# Patient Record
Sex: Female | Born: 1995 | Race: Black or African American | Hispanic: No | Marital: Married | Smoking: Never smoker
Health system: Southern US, Community
[De-identification: ages and names within clinical notes are randomized; demographics above are authoritative.]

## PROBLEM LIST (undated history)

## (undated) ENCOUNTER — Inpatient Hospital Stay (HOSPITAL_COMMUNITY): Payer: Self-pay

## (undated) DIAGNOSIS — R51 Headache: Secondary | ICD-10-CM

## (undated) DIAGNOSIS — R519 Headache, unspecified: Secondary | ICD-10-CM

## (undated) DIAGNOSIS — Z973 Presence of spectacles and contact lenses: Secondary | ICD-10-CM

## (undated) DIAGNOSIS — T7840XA Allergy, unspecified, initial encounter: Secondary | ICD-10-CM

## (undated) HISTORY — DX: Headache, unspecified: R51.9

## (undated) HISTORY — DX: Allergy, unspecified, initial encounter: T78.40XA

## (undated) HISTORY — DX: Presence of spectacles and contact lenses: Z97.3

## (undated) HISTORY — DX: Headache: R51

## (undated) HISTORY — PX: WISDOM TOOTH EXTRACTION: SHX21

---
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
## 2014-02-16 ENCOUNTER — Other Ambulatory Visit (INDEPENDENT_AMBULATORY_CARE_PROVIDER_SITE_OTHER): Payer: BC Managed Care – PPO

## 2014-02-16 DIAGNOSIS — Z23 Encounter for immunization: Secondary | ICD-10-CM

## 2014-02-16 DIAGNOSIS — Z111 Encounter for screening for respiratory tuberculosis: Secondary | ICD-10-CM

## 2014-02-19 LAB — TB SKIN TEST
Induration: 0 mm
TB Skin Test: NEGATIVE

## 2014-09-10 ENCOUNTER — Other Ambulatory Visit: Payer: Self-pay | Admitting: Family Medicine

## 2014-09-10 DIAGNOSIS — Z00129 Encounter for routine child health examination without abnormal findings: Secondary | ICD-10-CM

## 2014-09-10 MED ORDER — NORGESTIM-ETH ESTRAD TRIPHASIC 0.18/0.215/0.25 MG-35 MCG PO TABS
1.0000 | ORAL_TABLET | Freq: Every day | ORAL | Status: DC
Start: 2014-09-10 — End: 2014-09-30

## 2014-09-13 ENCOUNTER — Encounter: Payer: Self-pay | Admitting: Medical

## 2014-09-30 ENCOUNTER — Other Ambulatory Visit: Payer: Self-pay | Admitting: Medical

## 2014-10-29 ENCOUNTER — Other Ambulatory Visit: Payer: Self-pay | Admitting: Medical

## 2014-11-12 ENCOUNTER — Encounter: Payer: Self-pay | Admitting: Medical

## 2014-11-16 ENCOUNTER — Ambulatory Visit (INDEPENDENT_AMBULATORY_CARE_PROVIDER_SITE_OTHER): Payer: BLUE CROSS/BLUE SHIELD | Admitting: Medical

## 2014-11-16 ENCOUNTER — Encounter: Payer: Self-pay | Admitting: Medical

## 2014-11-16 VITALS — BP 110/70 | HR 92 | Temp 98.3°F | Resp 16 | Ht 68.5 in | Wt 241.0 lb

## 2014-11-16 DIAGNOSIS — L732 Hidradenitis suppurativa: Secondary | ICD-10-CM

## 2014-11-16 DIAGNOSIS — Z3040 Encounter for surveillance of contraceptives, unspecified: Secondary | ICD-10-CM

## 2014-11-16 DIAGNOSIS — Z113 Encounter for screening for infections with a predominantly sexual mode of transmission: Secondary | ICD-10-CM

## 2014-11-16 DIAGNOSIS — Z0000 Encounter for general adult medical examination without abnormal findings: Secondary | ICD-10-CM | POA: Diagnosis not present

## 2014-11-16 DIAGNOSIS — E669 Obesity, unspecified: Secondary | ICD-10-CM | POA: Diagnosis not present

## 2014-11-16 DIAGNOSIS — E049 Nontoxic goiter, unspecified: Secondary | ICD-10-CM

## 2014-11-16 DIAGNOSIS — R4589 Other symptoms and signs involving emotional state: Secondary | ICD-10-CM

## 2014-11-16 DIAGNOSIS — Z23 Encounter for immunization: Secondary | ICD-10-CM | POA: Diagnosis not present

## 2014-11-16 DIAGNOSIS — F329 Major depressive disorder, single episode, unspecified: Secondary | ICD-10-CM | POA: Diagnosis not present

## 2014-11-16 HISTORY — DX: Nontoxic goiter, unspecified: E04.9

## 2014-11-16 HISTORY — DX: Morbid (severe) obesity due to excess calories: E66.01

## 2014-11-16 HISTORY — DX: Hidradenitis suppurativa: L73.2

## 2014-11-16 LAB — POCT URINALYSIS DIPSTICK
Bilirubin, UA: NEGATIVE
Blood, UA: NEGATIVE
Glucose, UA: NEGATIVE
Ketones, UA: NEGATIVE
Leukocytes, UA: NEGATIVE
NITRITE UA: NEGATIVE
PH UA: 6
Spec Grav, UA: >=1.03
Urobilinogen, UA: NEGATIVE

## 2014-11-16 LAB — CBC
HEMATOCRIT: 41.5 % (ref 36.0–46.0)
HEMOGLOBIN: 13.6 g/dL (ref 12.0–15.0)
MCH: 29.1 pg (ref 26.0–34.0)
MCHC: 32.8 g/dL (ref 30.0–36.0)
MCV: 88.9 fL (ref 78.0–100.0)
MPV: 10.6 fL (ref 8.6–12.4)
Platelets: 384 10*3/uL (ref 150–400)
RBC: 4.67 MIL/uL (ref 3.87–5.11)
RDW: 13.8 % (ref 11.5–15.5)
WBC: 5.6 10*3/uL (ref 4.0–10.5)

## 2014-11-16 LAB — T4, FREE: Free T4: 1.11 ng/dL (ref 0.80–1.80)

## 2014-11-16 LAB — LIPID PANEL
CHOLESTEROL: 176 mg/dL (ref 0–200)
HDL: 44 mg/dL (ref 36–76)
LDL Cholesterol: 109 mg/dL — ABNORMAL HIGH (ref 0–99)
Total CHOL/HDL Ratio: 4 Ratio
Triglycerides: 117 mg/dL (ref <150)
VLDL: 23 mg/dL (ref 0–40)

## 2014-11-16 LAB — TSH: TSH: 4.142 u[IU]/mL (ref 0.350–4.500)

## 2014-11-16 LAB — T3: T3, Total: 208.9 ng/dL — ABNORMAL HIGH (ref 80.0–204.0)

## 2014-11-16 LAB — POCT URINE PREGNANCY: Preg Test, Ur: NEGATIVE

## 2014-11-16 MED ORDER — DOXYCYCLINE HYCLATE 100 MG PO TABS: 100.0000 mg | ORAL_TABLET | Freq: Two times a day (BID) | ORAL | Status: DC

## 2014-11-16 MED ORDER — NORGESTIM-ETH ESTRAD TRIPHASIC 0.18/0.215/0.25 MG-35 MCG PO TABS: 1.0000 | ORAL_TABLET | Freq: Every day | ORAL | Status: DC

## 2014-11-16 MED ORDER — CITALOPRAM HYDROBROMIDE 20 MG PO TABS: ORAL_TABLET | ORAL | Status: DC

## 2014-11-16 NOTE — Patient Instructions (Signed)
For hidradenitis   Begin doxycycline antibiotic twice daily for 7-10 days.     If this flares up again, you have 1 refill on this medication  For depressed mood   Establish with counseling and psychiatry in your college town  Call me within 2 weeks to let me know how the medication is going, but call sooner if needed  Hidradenitis Suppurativa, Sweat Gland Abscess Hidradenitis suppurativa is a long lasting (chronic), uncommon disease of the sweat glands. With this, boil-like lumps and scarring develop in the groin, some times under the arms (axillae), and under the breasts. It may also uncommonly occur behind the ears, in the crease of the buttocks, and around the genitals.  CAUSES  The cause is from a blocking of the sweat glands. They then become infected. It may cause drainage and odor. It is not contagious. So it cannot be given to someone else. It most often shows up in puberty (about 4610 to 20 years of age). But it may happen much later. It is similar to acne which is a disease of the sweat glands. This condition is slightly more common in African-Americans and women. SYMPTOMS  3. Hidradenitis usually starts as one or more red, tender, swellings in the groin or under the arms (axilla). 4. Over a period of hours to days the lesions get larger. They often open to the skin surface, draining clear to yellow-colored fluid. 5. The infected area heals with scarring. DIAGNOSIS  Your caregiver makes this diagnosis by looking at you. Sometimes cultures (growing germs on plates in the lab) may be taken. This is to see what germ (bacterium) is causing the infection.  TREATMENT   Topical germ killing medicine applied to the skin (antibiotics) are the treatment of choice. Antibiotics taken by mouth (systemic) are sometimes needed when the condition is getting worse or is severe.  Avoid tight-fitting clothing which traps moisture in.  Dirt does not cause hidradenitis and it is not caused by poor  hygiene.  Involved areas should be cleaned daily using an antibacterial soap. Some patients find that the liquid form of Lever 2000, applied to the involved areas as a lotion after bathing, can help reduce the odor related to this condition.  Sometimes surgery is needed to drain infected areas or remove scarred tissue. Removal of large amounts of tissue is used only in severe cases.  Birth control pills may be helpful.  Oral retinoids (vitamin A derivatives) for 6 to 12 months which are effective for acne may also help this condition.  Weight loss will improve but not cure hidradenitis. It is made worse by being overweight. But the condition is not caused by being overweight.  This condition is more common in people who have had acne.  It may become worse under stress. There is no medical cure for hidradenitis. It can be controlled, but not cured. The condition usually continues for years with periods of getting worse and getting better (remission). Document Released: 04/07/2004 Document Revised: 11/16/2011 Document Reviewed: 11/24/2013 [facility] Patient Information 2015 [place], [place]. This information is not intended to replace advice given to you by your health care provider. Make sure you discuss any questions you have with your health care provider.    Depression Depression refers to feeling sad, low, down in the dumps, blue, gloomy, or empty. In general, there are two kinds of depression: 6. Normal sadness or normal grief. This kind of depression is one that we all feel from time to time after upsetting  life experiences, such as the loss of a job or the ending of a relationship. This kind of depression is considered normal, is short lived, and resolves within a few days to 2 weeks. Depression experienced after the loss of a loved one (bereavement) often lasts longer than 2 weeks but normally gets better with time. 7. Clinical depression. This kind of depression lasts longer than normal  sadness or normal grief or interferes with your ability to function at home, at work, and in school. It also interferes with your personal relationships. It affects almost every aspect of your life. Clinical depression is an illness. Symptoms of depression can also be caused by conditions other than those mentioned above, such as:  Physical illness. Some physical illnesses, including underactive thyroid gland (hypothyroidism), severe anemia, specific types of cancer, diabetes, uncontrolled seizures, heart and lung problems, strokes, and chronic pain are commonly associated with symptoms of depression.  Side effects of some prescription medicine. In some people, certain types of medicine can cause symptoms of depression.  Substance abuse. Abuse of alcohol and illicit drugs can cause symptoms of depression. SYMPTOMS Symptoms of normal sadness and normal grief include the following:  Feeling sad or crying for short periods of time.  Not caring about anything (apathy).  Difficulty sleeping or sleeping too much.  No longer able to enjoy the things you used to enjoy.  Desire to be by oneself all the time (social isolation).  Lack of energy or motivation.  Difficulty concentrating or remembering.  Change in appetite or weight.  Restlessness or agitation. Symptoms of clinical depression include the same symptoms of normal sadness or normal grief and also the following symptoms:  Feeling sad or crying all the time.  Feelings of guilt or worthlessness.  Feelings of hopelessness or helplessness.  Thoughts of suicide or the desire to harm yourself (suicidal ideation).  Loss of touch with reality (psychotic symptoms). Seeing or hearing things that are not real (hallucinations) or having false beliefs about your life or the people around you (delusions and paranoia). DIAGNOSIS  The diagnosis of clinical depression is usually based on how bad the symptoms are and how long they have lasted.  Your health care provider will also ask you questions about your medical history and substance use to find out if physical illness, use of prescription medicine, or substance abuse is causing your depression. Your health care provider may also order blood tests. TREATMENT  Often, normal sadness and normal grief do not require treatment. However, sometimes antidepressant medicine is given for bereavement to ease the depressive symptoms until they resolve. The treatment for clinical depression depends on how bad the symptoms are but often includes antidepressant medicine, counseling with a mental health professional, or both. Your health care provider will help to determine what treatment is best for you. Depression caused by physical illness usually goes away with appropriate medical treatment of the illness. If prescription medicine is causing depression, talk with your health care provider about stopping the medicine, decreasing the dose, or changing to another medicine. Depression caused by the abuse of alcohol or illicit drugs goes away when you stop using these substances. Some adults need professional help in order to stop drinking or using drugs. SEEK IMMEDIATE MEDICAL CARE IF:  You have thoughts about hurting yourself or others.  You lose touch with reality (have psychotic symptoms).  You are taking medicine for depression and have a serious side effect. FOR MORE INFORMATION  National Alliance on Mental Illness: www.nami.org  [employer] of Mental Health: http://www.maynard.net/ Document Released: 08/21/2000 Document Revised: 01/08/2014 Document Reviewed: 11/23/2011 [facility] Patient Information 2015 [place], [place]. This information is not intended to replace advice given to you by your health care provider. Make sure you discuss any questions you have with your health care provider.

## 2014-11-16 NOTE — Progress Notes (Signed)
Subjective:   HPI  Brandi Manning is a 19 y.o. female who presents for a complete physical.   Preventative care: Last ophthalmology visit:n/a old doctor retired Last dental visit:n/a prior vaccinations: TD or Tdap:02/2014 Influenza:declined  Concerns: Gets sores under arms from time to time.  No pus drainage.  Has had these for a few weeks at a time in the past, they come and go.  No prior treatment.  Been dealing with depressed mood.  She notes feeling this way even back in high school, but sought out treatment early this freshmen year of college.   Had a few counseling visits but felt like the counselor was too busy with other things.   Feels down in mood.  Dad lives in [place], possibly going to prison, and he is suppose to be getting married soon too.  Her relationship with mom is good.  She is in a relationship with female x 11 mo.  Sexual activity, using condom.  No prior STD testing.  No problems with current OCPs.  No SI, no HI.  No other aggravating or relieving factors. No other complaint.  Reviewed their medical, surgical, family, social, medication, and allergy history and updated chart as appropriate.  Review of Systems Constitutional: -fever, -chills, -sweats, -unexpected weight change, -decreased appetite, -fatigue Allergy: -sneezing, -itching, -congestion Dermatology: -changing moles, --rash, -lumps ENT: -runny nose, -ear pain, -sore throat, -hoarseness, -sinus pain, -teeth pain, - ringing in ears, -hearing loss, -nosebleeds Cardiology: -chest pain, -palpitations, -swelling, -difficulty breathing when lying flat, -waking up short of breath Respiratory: -cough, -shortness of breath, -difficulty breathing with exercise or exertion, -wheezing, -coughing up blood Gastroenterology: -abdominal pain, -nausea, -vomiting, -diarrhea, -constipation, -blood in stool, -changes in bowel movement, -difficulty swallowing or eating Hematology: -bleeding, -bruising  Musculoskeletal: -joint  aches, -muscle aches, -joint swelling, -back pain, -neck pain, -cramping, -changes in gait Ophthalmology: denies vision changes, eye redness, itching, discharge Urology: -burning with urination, -difficulty urinating, -blood in urine, -urinary frequency, -urgency, -incontinence Neurology: -headache, -weakness, -tingling, -numbness, -memory loss, -falls, -dizziness Psychology: +depressed mood, -agitation, -sleep problems  Past Medical History  Diagnosis Date  . Wears glasses   . Headache     intermittent  . Allergy     seasonal    Past Surgical History  Procedure Laterality Date  . Wisdom tooth extraction      History   Social History  . Marital Status: Single    Spouse Name: N/A  . Number of Children: N/A  . Years of Education: N/A   Occupational History  . Not on file.   Social History Main Topics  . Smoking status: Never Smoker   . Smokeless tobacco: Not on file  . Alcohol Use: No  . Drug Use: No  . Sexual Activity: No   Other Topics Concern  . Not on file   Social History Narrative   [employer]ent to [employer].  Freshman at [employer] and wants to major in elementary education.   Exercise - sometimes, walking, diet is "poor."   In the environmental club.      Family History  Problem Relation Age of Onset  . Hypertension Mother   . Arthritis Mother   . Heart disease Mother     ?  . Diabetes Maternal Grandmother   . Heart disease Paternal Grandfather      Current outpatient prescriptions:  .  Norgestimate-Ethinyl Estradiol Triphasic (TRI-PREVIFEM) 0.18/0.215/0.25 MG-35 MCG tablet, Take 1 tablet by mouth daily., Disp: 28 tablet, Rfl: 11 .  citalopram (CELEXA) 20 MG tablet, 1/2 tablet po QHS x 1wk, then 1 tablet po QHS, Disp: 30 tablet, Rfl: 1 .  doxycycline (VIBRA-TABS) 100 MG tablet, Take 1 tablet (100 mg total) by mouth 2 (two) times daily., Disp: 20 tablet, Rfl: 1  No Known Allergies       Objective:   Physical Exam  Filed Vitals:    11/16/14 1022  BP: 110/70  Pulse: 92  Temp: 98.3 F (36.8 C)  Resp: 16   Wt Readings from Last 3 Encounters:  11/16/14 241 lb (109.317 kg) (99 %*, Z = 2.43)  12/28/13 220 lb (99.791 kg) (99 %*, Z = 2.20)  10/25/13 224 lb (101.606 kg) (99 %*, Z = 2.24)   * Growth percentiles are based on CDC 2-20 Years data.   General appearance: alert, no distress, WD/WN, obese AA female Skin: no worrisome lesions HEENT: normocephalic, conjunctiva/corneas normal, sclerae anicteric, PERRLA, EOMi, nares patent, no discharge or erythema, pharynx normal Oral cavity: MMM, tongue normal, teeth normal Neck: supple, no lymphadenopathy, +goiter mild,no distinct nodule, no masses, normal ROM Chest: non tender, normal shape and expansion Heart: RRR, normal S1, S2, no murmurs Lungs: CTA bilaterally, no wheezes, rhonchi, or rales Abdomen: +bs, soft, non tender, non distended, no masses, no hepatomegaly, no splenomegaly, no bruits Back: non tender, normal ROM, no scoliosis Musculoskeletal: upper extremities non tender, no obvious deformity, normal ROM throughout, lower extremities non tender, no obvious deformity, normal ROM throughout Extremities: no edema, no cyanosis, no clubbing Pulses: 2+ symmetric, upper and lower extremities, normal cap refill Neurological: alert, oriented x 3, CN2-12 intact, strength normal upper extremities and lower extremities, sensation normal throughout, DTRs 2+ throughout, no cerebellar signs, gait normal Psychiatric: pleasant , depressed affect,no smiling, seems down Breast: deferred Gyn: Normal external genitalia without lesions, vagina with normal mucosa, cervix without lesions, no cervical motion tenderness, no abnormal vaginal discharge.  Uterus and adnexa not enlarged, nontender, no masses.  Swab taken.  Exam chaperoned by nurse. Rectal: deferred   Assessment and Plan :    Encounter Diagnoses  Name Primary?  . Encounter for health maintenance examination in adult Yes  .  Depressed mood   . Hidradenitis axillaris   . Goiter   . Screen for STD (sexually transmitted disease)   . Need for HPV vaccination   . Obesity   . Encounter for surveillance of contraceptives     Physical exam - discussed healthy lifestyle, diet, exercise, preventative care, vaccinations, and addressed their concerns.  Routine labs today.  See your eye doctor yearly for routine vision care.  See your dentist yearly for routine dental care including hygiene visits twice yearly.  Depressed mood - PHQ-9 score of 19.  Discussed concerns, advised she seek out a different counselor.   Begin trial of Citalopram.  discussed risk/benefits of medication, discuss with mom that she is starting medication and to be aware of any adverse effects.    Hidradenitis - discussed hygiene, begin doxycyline, call report 1 wk.  Goiter - labs today  STD screening today. Discussed safe sex.  HPV vaccine - Counseled on the Human Papilloma virus vaccine.  Vaccine information sheet given.  HPV vaccine given after consent obtained.  Patient was advised to return in 6 months for HPV #3.    Obesity - discussed diet, exercise, healthy habits  Contraception - We discussed risks of OCPs including headache, nausea, breast tenderness, irregular bleeding or spotting, possible risks of blood clots, heart attack, stroke.  Discussed advantages of OCPs including possible decreased premenstrual symptoms, improving cramps, regulating cycles, decreasing heavy flow.  Discussed proper use of medication.  She understands the benefits, risks, and wishes to begin OCPs. Urine pregnancy negative.    Discussed safe sex, STD prevention, condom use.     Follow-up pending labs

## 2014-11-17 LAB — HIV ANTIBODY (ROUTINE TESTING W REFLEX): HIV 1&2 Ab, 4th Generation: NONREACTIVE

## 2014-11-17 LAB — GC/CHLAMYDIA PROBE AMP
CT Probe RNA: NEGATIVE
GC PROBE AMP APTIMA: NEGATIVE

## 2014-11-17 LAB — HEMOGLOBIN A1C
HEMOGLOBIN A1C: 5.4 % (ref <5.7)
MEAN PLASMA GLUCOSE: 108 mg/dL (ref <117)

## 2014-11-17 LAB — RPR

## 2014-11-22 ENCOUNTER — Other Ambulatory Visit: Payer: Self-pay | Admitting: Medical

## 2015-01-31 ENCOUNTER — Encounter: Payer: Self-pay | Admitting: Medical

## 2015-01-31 ENCOUNTER — Ambulatory Visit (INDEPENDENT_AMBULATORY_CARE_PROVIDER_SITE_OTHER): Payer: BLUE CROSS/BLUE SHIELD | Admitting: Medical

## 2015-01-31 VITALS — BP 120/78 | HR 86 | Temp 98.1°F | Resp 15 | Wt 237.0 lb

## 2015-01-31 DIAGNOSIS — F331 Major depressive disorder, recurrent, moderate: Secondary | ICD-10-CM

## 2015-01-31 MED ORDER — FLUOXETINE HCL 20 MG PO TABS: 20.0000 mg | ORAL_TABLET | Freq: Every day | ORAL | Status: DC

## 2015-01-31 NOTE — Patient Instructions (Signed)
Lets wean off Citalopram by taking 1/2 tablet daily for 5 days.  At the same time, take Prozac/Fluoxetine 1/2 tablet daily for 5 days  At day 6, go completely to 1 whole tablet daily of Fluoxetine/Prozac 20mg  daily  Lets recheck in about a month.

## 2015-01-31 NOTE — Progress Notes (Signed)
Subjective: Here for f/u on depression.  Medication not working.  Takes Citalopram at night.   Feels like she sleeps more, doesn't feel improvement on mood.   She is currently on summer break, looking for job.  Has been seeing a different counselor at school since last visit which has been helpful.  While home now she is staying with mom.  Great relationship with mom.   Recently got a bunny and taking care of the bunny, this brings her joy.  Been going to the gym.  Not attending church often.   Counselor has been conducting some phone visits with her recently while not in school.  In school at [employer].  She notes long history of depression symptoms going back to high school.  didn't initially seek out treatment until freshmen year of college. Had a few counseling visits but felt like the counselor was too busy with other things. Feels down in mood. Dad lives in [place] in prison. No SI, no HI. No other aggravating or relieving factors. No other complaint.   Objective: BP 120/78 mmHg  Pulse 86  Temp(Src) 98.1 F (36.7 C) (Oral)  Resp 15  Wt 237 lb (107.502 kg)  Gen: wd, wn, nad Psych: pleasant, good eye contact, answers questions appropriately   Assessment: Encounter Diagnosis  Name Primary?  . Major depressive disorder, recurrent episode, moderate Yes    Plan: Advised she c/t counseling, advised she and mom go to church, advised she c/t exercise, and medication changes today as below.  Failed citalopram, so begin trial of Fluoxetine.  F/u 46mo, sooner prn.   Patient Instructions  Lets wean off Citalopram by taking 1/2 tablet daily for 5 days.  At the same time, take Prozac/Fluoxetine 1/2 tablet daily for 5 days  At day 6, go completely to 1 whole tablet daily of Fluoxetine/Prozac 20mg  daily  Lets recheck in about a month.    30 min face to face with discussion, plan.

## 2015-03-30 ENCOUNTER — Emergency Department (HOSPITAL_COMMUNITY)
Admission: EM | Admit: 2015-03-30 | Discharge: 2015-03-30 | Disposition: A | Discharge status: Home / Self Care | Payer: BLUE CROSS/BLUE SHIELD | Attending: Emergency Medicine | Admitting: Emergency Medicine

## 2015-03-30 ENCOUNTER — Encounter (HOSPITAL_COMMUNITY): Payer: Self-pay | Admitting: Emergency Medicine

## 2015-03-30 DIAGNOSIS — R000 Tachycardia, unspecified: Secondary | ICD-10-CM | POA: Insufficient documentation

## 2015-03-30 DIAGNOSIS — R200 Anesthesia of skin: Secondary | ICD-10-CM | POA: Diagnosis not present

## 2015-03-30 DIAGNOSIS — R109 Unspecified abdominal pain: Secondary | ICD-10-CM | POA: Diagnosis present

## 2015-03-30 DIAGNOSIS — R61 Generalized hyperhidrosis: Secondary | ICD-10-CM | POA: Diagnosis not present

## 2015-03-30 DIAGNOSIS — R112 Nausea with vomiting, unspecified: Secondary | ICD-10-CM | POA: Insufficient documentation

## 2015-03-30 DIAGNOSIS — E860 Dehydration: Secondary | ICD-10-CM

## 2015-03-30 DIAGNOSIS — Z79818 Long term (current) use of other agents affecting estrogen receptors and estrogen levels: Secondary | ICD-10-CM | POA: Insufficient documentation

## 2015-03-30 DIAGNOSIS — E869 Volume depletion, unspecified: Secondary | ICD-10-CM | POA: Diagnosis not present

## 2015-03-30 LAB — CBC WITH DIFFERENTIAL/PLATELET
BASOS PCT: 0 % (ref 0–1)
Basophils Absolute: 0 10*3/uL (ref 0.0–0.1)
EOS ABS: 0.4 10*3/uL (ref 0.0–0.7)
Eosinophils Relative: 4 % (ref 0–5)
HCT: 45.8 % (ref 36.0–46.0)
Hemoglobin: 15 g/dL (ref 12.0–15.0)
LYMPHS ABS: 2.8 10*3/uL (ref 0.7–4.0)
Lymphocytes Relative: 29 % (ref 12–46)
MCH: 29 pg (ref 26.0–34.0)
MCHC: 32.8 g/dL (ref 30.0–36.0)
MCV: 88.4 fL (ref 78.0–100.0)
Monocytes Absolute: 0.7 10*3/uL (ref 0.1–1.0)
Monocytes Relative: 7 % (ref 3–12)
Neutro Abs: 5.8 10*3/uL (ref 1.7–7.7)
Neutrophils Relative %: 60 % (ref 43–77)
PLATELETS: 377 10*3/uL (ref 150–400)
RBC: 5.18 MIL/uL — AB (ref 3.87–5.11)
RDW: 13 % (ref 11.5–15.5)
WBC: 9.7 10*3/uL (ref 4.0–10.5)

## 2015-03-30 LAB — BASIC METABOLIC PANEL
Anion gap: 7 (ref 5–15)
BUN: 11 mg/dL (ref 6–20)
CO2: 24 mmol/L (ref 22–32)
Calcium: 9.1 mg/dL (ref 8.9–10.3)
Chloride: 108 mmol/L (ref 101–111)
Creatinine, Ser: 1.03 mg/dL — ABNORMAL HIGH (ref 0.44–1.00)
GFR calc Af Amer: >60 mL/min (ref >60)
GFR calc non Af Amer: >60 mL/min (ref >60)
GLUCOSE: 109 mg/dL — AB (ref 65–99)
POTASSIUM: 4.1 mmol/L (ref 3.5–5.1)
SODIUM: 139 mmol/L (ref 135–145)

## 2015-03-30 LAB — HCG, SERUM, QUALITATIVE: Preg, Serum: NEGATIVE

## 2015-03-30 MED ORDER — ONDANSETRON HCL 4 MG/2ML IJ SOLN
4.0000 mg | Freq: Once | INTRAMUSCULAR | Status: AC
  Administered 2015-03-30: 4 mg via INTRAVENOUS
  Filled 2015-03-30: qty 2

## 2015-03-30 MED ORDER — SODIUM CHLORIDE 0.9 % IV BOLUS (SEPSIS)
2000.0000 mL | Freq: Once | INTRAVENOUS | Status: AC
  Administered 2015-03-30: 2000 mL via INTRAVENOUS

## 2015-03-30 NOTE — Discharge Instructions (Signed)
Rest. Push fluids. Allow at least 2 weeks in between any future episodes of plasma donation.  Dehydration, Adult Dehydration is when you lose more fluids from the body than you take in. Vital organs like the kidneys, brain, and heart cannot function without a proper amount of fluids and salt. Any loss of fluids from the body can cause dehydration.  CAUSES   Vomiting.  Diarrhea.  Excessive sweating.  Excessive urine output.  Fever. SYMPTOMS  Mild dehydration  Thirst.  Dry lips.  Slightly dry mouth. Moderate dehydration  Very dry mouth.  Sunken eyes.  Skin does not bounce back quickly when lightly pinched and released.  Dark urine and decreased urine production.  Decreased tear production.  Headache. Severe dehydration  Very dry mouth.  Extreme thirst.  Rapid, weak pulse (more than 100 beats per minute at rest).  Cold hands and feet.  Not able to sweat in spite of heat and temperature.  Rapid breathing.  Blue lips.  Confusion and lethargy.  Difficulty being awakened.  Minimal urine production.  No tears. DIAGNOSIS  Your caregiver will diagnose dehydration based on your symptoms and your exam. Blood and urine tests will help confirm the diagnosis. The diagnostic evaluation should also identify the cause of dehydration. TREATMENT  Treatment of mild or moderate dehydration can often be done at home by increasing the amount of fluids that you drink. It is best to drink small amounts of fluid more often. Drinking too much at one time can make vomiting worse. Refer to the home care instructions below. Severe dehydration needs to be treated at the hospital where you will probably be given intravenous (IV) fluids that contain water and electrolytes. HOME CARE INSTRUCTIONS   Ask your caregiver about specific rehydration instructions.  Drink enough fluids to keep your urine clear or pale yellow.  Drink small amounts frequently if you have nausea and  vomiting.  Eat as you normally do.  Avoid:  Foods or drinks high in sugar.  Carbonated drinks.  Juice.  Extremely hot or cold fluids.  Drinks with caffeine.  Fatty, greasy foods.  Alcohol.  Tobacco.  Overeating.  Gelatin desserts.  Wash your hands well to avoid spreading bacteria and viruses.  Only take over-the-counter or prescription medicines for pain, discomfort, or fever as directed by your caregiver.  Ask your caregiver if you should continue all prescribed and over-the-counter medicines.  Keep all follow-up appointments with your caregiver. SEEK MEDICAL CARE IF:  You have abdominal pain and it increases or stays in one area (localizes).  You have a rash, stiff neck, or severe headache.  You are irritable, sleepy, or difficult to awaken.  You are weak, dizzy, or extremely thirsty. SEEK IMMEDIATE MEDICAL CARE IF:   You are unable to keep fluids down or you get worse despite treatment.  You have frequent episodes of vomiting or diarrhea.  You have blood or green matter (bile) in your vomit.  You have blood in your stool or your stool looks black and tarry.  You have not urinated in 6 to 8 hours, or you have only urinated a small amount of very dark urine.  You have a fever.  You faint. MAKE SURE YOU:   Understand these instructions.  Will watch your condition.  Will get help right away if you are not doing well or get worse. Document Released: 08/24/2005 Document Revised: 11/16/2011 Document Reviewed: 04/13/2011 [facility] Patient Information 2015 [place], [place]. This information is not intended to replace advice given to you  by your health care provider. Make sure you discuss any questions you have with your health care provider.  Rehydration, Adult Rehydration is the replacement of body fluids lost during dehydration. Dehydration is an extreme loss of body fluids to the point of body function impairment. There are many ways extreme fluid loss  can occur, including vomiting, diarrhea, or excess sweating. Recovering from dehydration requires replacing lost fluids, continuing to eat to maintain strength, and avoiding foods and beverages that may contribute to further fluid loss or may increase nausea. HOW TO REHYDRATE In most cases, rehydration involves the replacement of not only fluids but also carbohydrates and basic body salts. Rehydration with an oral rehydration solution is one way to replace essential nutrients lost through dehydration. An oral rehydration solution can be purchased at pharmacies, retail stores, and online. Premixed packets of powder that you combine with water to make a solution are also sold. You can prepare an oral rehydration solution at home by mixing the following ingredients together:    - tsp table salt.   tsp baking soda.   tsp salt substitute containing potassium chloride.  1 tablespoons sugar.  1 L (34 oz) of water. Be sure to use exact measurements. Including too much sugar can make diarrhea worse. Drink -1 cup (120-240 mL) of oral rehydration solution each time you have diarrhea or vomit. If drinking this amount makes your vomiting worse, try drinking smaller amounts more often. For example, drink 1-3 tsp every 5-10 minutes.  A general rule for staying hydrated is to drink 1-2 L of fluid per day. Talk to your caregiver about the specific amount you should be drinking each day. Drink enough fluids to keep your urine clear or pale yellow. EATING WHEN DEHYDRATED Even if you have had severe sweating or you are having diarrhea, do not stop eating. Many healthy items in a normal diet are okay to continue eating while recovering from dehydration. The following tips can help you to lessen nausea when you eat:  Ask someone else to prepare your food. Cooking smells may worsen nausea.  Eat in a well-ventilated room away from cooking smells.  Sit up when you eat. Avoid lying down until 1-2 hours after  eating.  Eat small amounts when you eat.  Eat foods that are easy to digest. These include soft, well-cooked, or mashed foods. FOODS AND BEVERAGES TO AVOID Avoid eating or drinking the following foods and beverages that may increase nausea or further loss of fluid:   Fruit juices with a high sugar content, such as concentrated juices.  Alcohol.  Beverages containing caffeine.  Carbonated drinks. They may cause a lot of gas.  Foods that may cause a lot of gas, such as cabbage, broccoli, and beans.  Fatty, greasy, and fried foods.  Spicy, very salty, and very sweet foods or drinks.  Foods or drinks that are very hot or very cold. Consume food or drinks at or near room temperature.  Foods that need a lot of chewing, such as raw vegetables.  Foods that are sticky or hard to swallow, such as peanut butter. Document Released: 11/16/2011 Document Revised: 05/18/2012 Document Reviewed: 11/16/2011 [facility] Patient Information 2015 [place], [place]. This information is not intended to replace advice given to you by your health care provider. Make sure you discuss any questions you have with your health care provider.

## 2015-03-30 NOTE — ED Notes (Signed)
Per EMS- was giving plasma today-became weak/diaphoretic and having abdominal cramps. Gave her animal crackers and two bottles of gatorade. Denied nausea up until arrival at ED- nausea started just now. Appeared to throw up all of the blue gatorade she drank. VS: BP 102/56 HR 68 SpO2 98% on RA. RR 18. CBG 180 mg/dl.

## 2015-03-30 NOTE — ED Notes (Signed)
Bed: WA24 Expected date:  Expected time:  Means of arrival:  Comments: Triage 2 

## 2015-03-30 NOTE — ED Provider Notes (Signed)
CSN: [id number]   Arrival date & time 03/30/15 1455  History  This chart was scribed for  Rolland Porter, MD by Bethel Born, ED Scribe. This patient was seen in room WTR2/WLPT2 and the patient's care was started at 3:15 PM.  Chief Complaint  Patient presents with  . Abdominal Pain  . Nausea    HPI The history is provided by the patient. No language interpreter was used.   Vivica Keeran is a 19 y.o. female who presents to the Emergency Department complaining of nausea and vomiting with onset today while donating plasma. Pt was unable to hold down the Gatorade that she was given after donating. She has given plasma 2 other times with the last time being almost a week ago. Associated symptoms include generalized weakness, dizziness, sweating, abdominal pain, and numbness in the hands. Pt denies chest pain and SOB. She has not spent more time outside recently. Denies pregnancy. No daily medication besides oral contraception.   Past Medical History  Diagnosis Date  . Wears glasses   . Headache     intermittent  . Allergy     seasonal    Past Surgical History  Procedure Laterality Date  . Wisdom tooth extraction      Family History  Problem Relation Age of Onset  . Hypertension Mother   . Arthritis Mother   . Heart disease Mother     ?  . Diabetes Maternal Grandmother   . Heart disease Paternal Grandfather     History  Substance Use Topics  . Smoking status: Never Smoker   . Smokeless tobacco: Not on file  . Alcohol Use: No     Review of Systems  Constitutional: Positive for diaphoresis. Negative for fever, chills, appetite change and fatigue.  HENT: Negative for mouth sores, sore throat and trouble swallowing.   Eyes: Negative for visual disturbance.  Respiratory: Negative for cough, chest tightness, shortness of breath and wheezing.   Cardiovascular: Negative for chest pain.  Gastrointestinal: Positive for nausea, vomiting and abdominal pain. Negative for diarrhea and  abdominal distention.  Endocrine: Negative for polydipsia, polyphagia and polyuria.  Genitourinary: Negative for dysuria, frequency and hematuria.  Musculoskeletal: Negative for gait problem.  Skin: Negative for color change, pallor and rash.  Neurological: Positive for dizziness, weakness and numbness. Negative for syncope, light-headedness and headaches.  Hematological: Does not bruise/bleed easily.  Psychiatric/Behavioral: Negative for behavioral problems and confusion.     Home Medications   Prior to Admission medications   Medication Sig Start Date End Date Taking? Authorizing Provider  Norgestimate-Ethinyl Estradiol Triphasic (TRI-PREVIFEM) 0.18/0.215/0.25 MG-35 MCG tablet Take 1 tablet by mouth daily. 11/16/14  Yes Kermit Balo Tysinger, PA-C  doxycycline (VIBRA-TABS) 100 MG tablet Take 1 tablet (100 mg total) by mouth 2 (two) times daily. Patient not taking: Reported on 03/30/2015 11/16/14   Kermit Balo Tysinger, PA-C  FLUoxetine (PROZAC) 20 MG tablet Take 1 tablet (20 mg total) by mouth daily. Patient not taking: Reported on 03/30/2015 01/31/15   Kermit Balo Tysinger, PA-C    Allergies  Review of patient's allergies indicates no known allergies.  Triage Vitals: BP 105/69 mmHg  Pulse 112  Temp(Src) 97.5 F (36.4 C) (Oral)  Resp 16  LMP 03/17/2015 (Exact Date)  Physical Exam  Constitutional: She is oriented to person, place, and time. She appears well-developed and well-nourished. No distress.  HENT:  Head: Normocephalic.  Eyes: Conjunctivae are normal. Pupils are equal, round, and reactive to light. No scleral icterus.  Neck: Normal range  of motion. Neck supple. No thyromegaly present.  Cardiovascular: Regular rhythm.  Tachycardia present.  Exam reveals no gallop and no friction rub.   No murmur heard. Sinus on monitor  Pulmonary/Chest: Effort normal and breath sounds normal. No respiratory distress. She has no wheezes. She has no rales.  Abdominal: Soft. Bowel sounds are normal. She  exhibits no distension. There is no tenderness. There is no rebound.  Musculoskeletal: Normal range of motion.  Neurological: She is alert and oriented to person, place, and time.  Skin: Skin is warm and dry. No rash noted.  Psychiatric: She has a normal mood and affect. Her behavior is normal.    ED Course  Procedures   DIAGNOSTIC STUDIES: Oxygen Saturation is 100% on RA, normal by my interpretation.    COORDINATION OF CARE: 3:20 PM Discussed treatment plan which includes lab work and IVF with pt at bedside and pt agreed to plan.  Labs Review-  Labs Reviewed  CBC WITH DIFFERENTIAL/PLATELET - Abnormal; Notable for the following:    RBC 5.18 (*)    All other components within normal limits  BASIC METABOLIC PANEL - Abnormal; Notable for the following:    Glucose, Bld 109 (*)    Creatinine, Ser 1.03 (*)    All other components within normal limits  HCG, SERUM, QUALITATIVE    Imaging Review No results found.  EKG Interpretation None      MDM   Final diagnoses:  Fluid volume depletion  Dehydration   Reassuring labs. Feeling better." For it. Has urinated several times. Plan is home, rest, push fluids. Allow at least 2 weeks involving future episodes of plasma donation.  I personally performed the services described in this documentation, which was scribed in my presence. The recorded information has been reviewed and is accurate.     Rolland Porter, MD 03/30/15 [phone]

## 2015-03-30 NOTE — ED Notes (Signed)
Pt enroute to room 24

## 2015-03-30 NOTE — ED Notes (Signed)
Pt reports donating plasma today. Began feeling lightheaded, dizzy, nauseous during donation. Attempted to drink gatorade. Emesis x1 with ems. Zofran given in triage with good relief now. Has donated blood and plasma before without complications. Ate both breakfast and lunch today.

## 2015-07-08 ENCOUNTER — Other Ambulatory Visit: Payer: Self-pay | Admitting: Medical

## 2015-07-08 NOTE — Telephone Encounter (Signed)
Is this okay to refill? 

## 2015-07-09 NOTE — Telephone Encounter (Signed)
NEEDS either f/u appt or physical

## 2015-08-26 ENCOUNTER — Ambulatory Visit (INDEPENDENT_AMBULATORY_CARE_PROVIDER_SITE_OTHER): Payer: BLUE CROSS/BLUE SHIELD | Admitting: Medical

## 2015-08-26 ENCOUNTER — Encounter: Payer: Self-pay | Admitting: Medical

## 2015-08-26 VITALS — BP 118/62 | HR 93 | Wt 247.0 lb

## 2015-08-26 DIAGNOSIS — R5383 Other fatigue: Secondary | ICD-10-CM

## 2015-08-26 DIAGNOSIS — J309 Allergic rhinitis, unspecified: Secondary | ICD-10-CM

## 2015-08-26 DIAGNOSIS — R062 Wheezing: Secondary | ICD-10-CM

## 2015-08-26 DIAGNOSIS — N920 Excessive and frequent menstruation with regular cycle: Secondary | ICD-10-CM

## 2015-08-26 LAB — CBC
HCT: 41 % (ref 36.0–46.0)
Hemoglobin: 13.8 g/dL (ref 12.0–15.0)
MCH: 28.8 pg (ref 26.0–34.0)
MCHC: 33.7 g/dL (ref 30.0–36.0)
MCV: 85.6 fL (ref 78.0–100.0)
MPV: 10.3 fL (ref 8.6–12.4)
Platelets: 426 10*3/uL — ABNORMAL HIGH (ref 150–400)
RBC: 4.79 MIL/uL (ref 3.87–5.11)
RDW: 14 % (ref 11.5–15.5)
WBC: 7.2 10*3/uL (ref 4.0–10.5)

## 2015-08-26 LAB — IRON AND TIBC
%SAT: 21 % (ref 11–50)
Iron: 103 ug/dL (ref 40–190)
TIBC: 498 ug/dL — AB (ref 250–450)
UIBC: 395 ug/dL (ref 125–400)

## 2015-08-26 LAB — BASIC METABOLIC PANEL
BUN: 11 mg/dL (ref 7–25)
CALCIUM: 9.9 mg/dL (ref 8.6–10.2)
CO2: 24 mmol/L (ref 20–31)
CREATININE: 1.06 mg/dL (ref 0.50–1.10)
Chloride: 105 mmol/L (ref 98–110)
Glucose, Bld: 72 mg/dL (ref 65–99)
Potassium: 4.6 mmol/L (ref 3.5–5.3)
Sodium: 140 mmol/L (ref 135–146)

## 2015-08-26 NOTE — Progress Notes (Signed)
Subjective Chief Complaint  Patient presents with  . rabbit allergy    wants to get a medication for that. is having heavy breathing, and sleeping more than normal would like to know if its asthma. staying cold a lot of the time.    Here for several concerns.  Thinks she has allergy to bunnies.  Had pet bunnies in the home.  Any time she was away such as at college would be fine, but anytime she comes home, was having wheezing, breathing difficulty, runny nose and sneezing.   they got rid of rabbits this past weekend, and mom did household cleaning.  Slowly she is seeing improvements, using zyrtec QHS.   Nonsmoker, no smoking exposure or other new exposures. Has had the rabbits about a year.   No hx/o asthma.   Been cold and fatigued lately, wonders about anemia.  Has heavy periods.  No recent weight, hair or skin changes.  Wants to be screened for asthma.  Past Medical History  Diagnosis Date  . Wears glasses   . Headache     intermittent  . Allergy     seasonal   ROS as in subjective   Objective: BP 118/62 mmHg  Pulse 93  Wt 247 lb (112.038 kg)  SpO2 98%  LMP 08/23/2015  General appearance: alert, no distress, WD/WN, obese AA female HEENT: normocephalic, sclerae anicteric, mild injection of bilat conjunctiva, otherwise TMs pearly, nares patent, no discharge or erythema, pharynx normal Oral cavity: MMM, no lesions Neck: supple, no lymphadenopathy, no thyromegaly, no masses Heart: RRR, normal S1, S2, no murmurs Lungs: CTA bilaterally, no wheezes, rhonchi, or rales Pulses: 2+ symmetric, upper and lower extremities, normal cap refill    Assessment: Encounter Diagnoses  Name Primary?  . Wheezing Yes  . Allergic rhinitis, unspecified allergic rhinitis type   . Other fatigue   . Menorrhagia with regular cycle     Plan: After discussing her symptoms and concerns, its very possible she had allergy to the rabbits.  They got rid of the rabbits this past weekend.  Advised she  c/t zyrtec or benadryl QHS, can also begin Claritin in the mornings, and do this regiment through the first week when she is back at school.  Avoid triggers.   Labs today given the fatigue and possible anemia.  PFTs show normal spirometry.  Reassured.  If worse or not improving over the next week or 2, then call or return.

## 2015-08-26 NOTE — Patient Instructions (Signed)
Diverticulitis °Diverticulitis is inflammation or infection of small pouches in your colon that form when you have a condition called diverticulosis. The pouches in your colon are called diverticula. Your colon, or large intestine, is where water is absorbed and stool is formed. °Complications of diverticulitis can include: °· Bleeding. °· Severe infection. °· Severe pain. °· Perforation of your colon. °· Obstruction of your colon. °CAUSES  °Diverticulitis is caused by bacteria. °Diverticulitis happens when stool becomes trapped in diverticula. This allows bacteria to grow in the diverticula, which can lead to inflammation and infection. °RISK FACTORS °People with diverticulosis are at risk for diverticulitis. Eating a diet that does not include enough fiber from fruits and vegetables may make diverticulitis more likely to develop. °SYMPTOMS  °Symptoms of diverticulitis may include: °· Abdominal pain and tenderness. The pain is normally located on the left side of the abdomen, but may occur in other areas. °· Fever and chills. °· Bloating. °· Cramping. °· Nausea. °· Vomiting. °· Constipation. °· Diarrhea. °· Blood in your stool. °DIAGNOSIS  °Your health care provider will ask you about your medical history and do a physical exam. You may need to have tests done because many medical conditions can cause the same symptoms as diverticulitis. Tests may include: °· Blood tests. °· Urine tests. °· Imaging tests of the abdomen, including X-rays and CT scans. °When your condition is under control, your health care provider may recommend that you have a colonoscopy. A colonoscopy can show how severe your diverticula are and whether something else is causing your symptoms. °TREATMENT  °Most cases of diverticulitis are mild and can be treated at home. Treatment may include: °· Taking over-the-counter pain medicines. °· Following a clear liquid diet. °· Taking antibiotic medicines by mouth for 7-10 days. °More severe cases may  be treated at a hospital. Treatment may include: °· Not eating or drinking. °· Taking prescription pain medicine. °· Receiving antibiotic medicines through an IV tube. °· Receiving fluids and nutrition through an IV tube. °· Surgery. °HOME CARE INSTRUCTIONS  °· Follow your health care provider's instructions carefully. °· Follow a full liquid diet or other diet as directed by your health care provider. After your symptoms improve, your health care provider may tell you to change your diet. He or she may recommend you eat a high-fiber diet. Fruits and vegetables are good sources of fiber. Fiber makes it easier to pass stool. °· Take fiber supplements or probiotics as directed by your health care provider. °· Only take medicines as directed by your health care provider. °· Keep all your follow-up appointments. °SEEK MEDICAL CARE IF:  °· Your pain does not improve. °· You have a hard time eating food. °· Your bowel movements do not return to normal. °SEEK IMMEDIATE MEDICAL CARE IF:  °· Your pain becomes worse. °· Your symptoms do not get better. °· Your symptoms suddenly get worse. °· You have a fever. °· You have repeated vomiting. °· You have bloody or black, tarry stools. °MAKE SURE YOU:  °· Understand these instructions. °· Will watch your condition. °· Will get help right away if you are not doing well or get worse. °  °This information is not intended to replace advice given to you by your health care provider. Make sure you discuss any questions you have with your health care provider. °  °Document Released: 06/03/2005 Document Revised: 08/29/2013 Document Reviewed: 07/19/2013 °Elsevier Interactive Patient Education ©2016 Elsevier Inc. ° °

## 2015-09-22 IMAGING — CR DG CHEST 2V
2 series · 2 of 2 positions shown · non-contrast
Comparison: October 23, 2005

CLINICAL DATA: Cough and hypoxia

EXAM:
CHEST  2 VIEW

[view not recorded (1 of 2)]
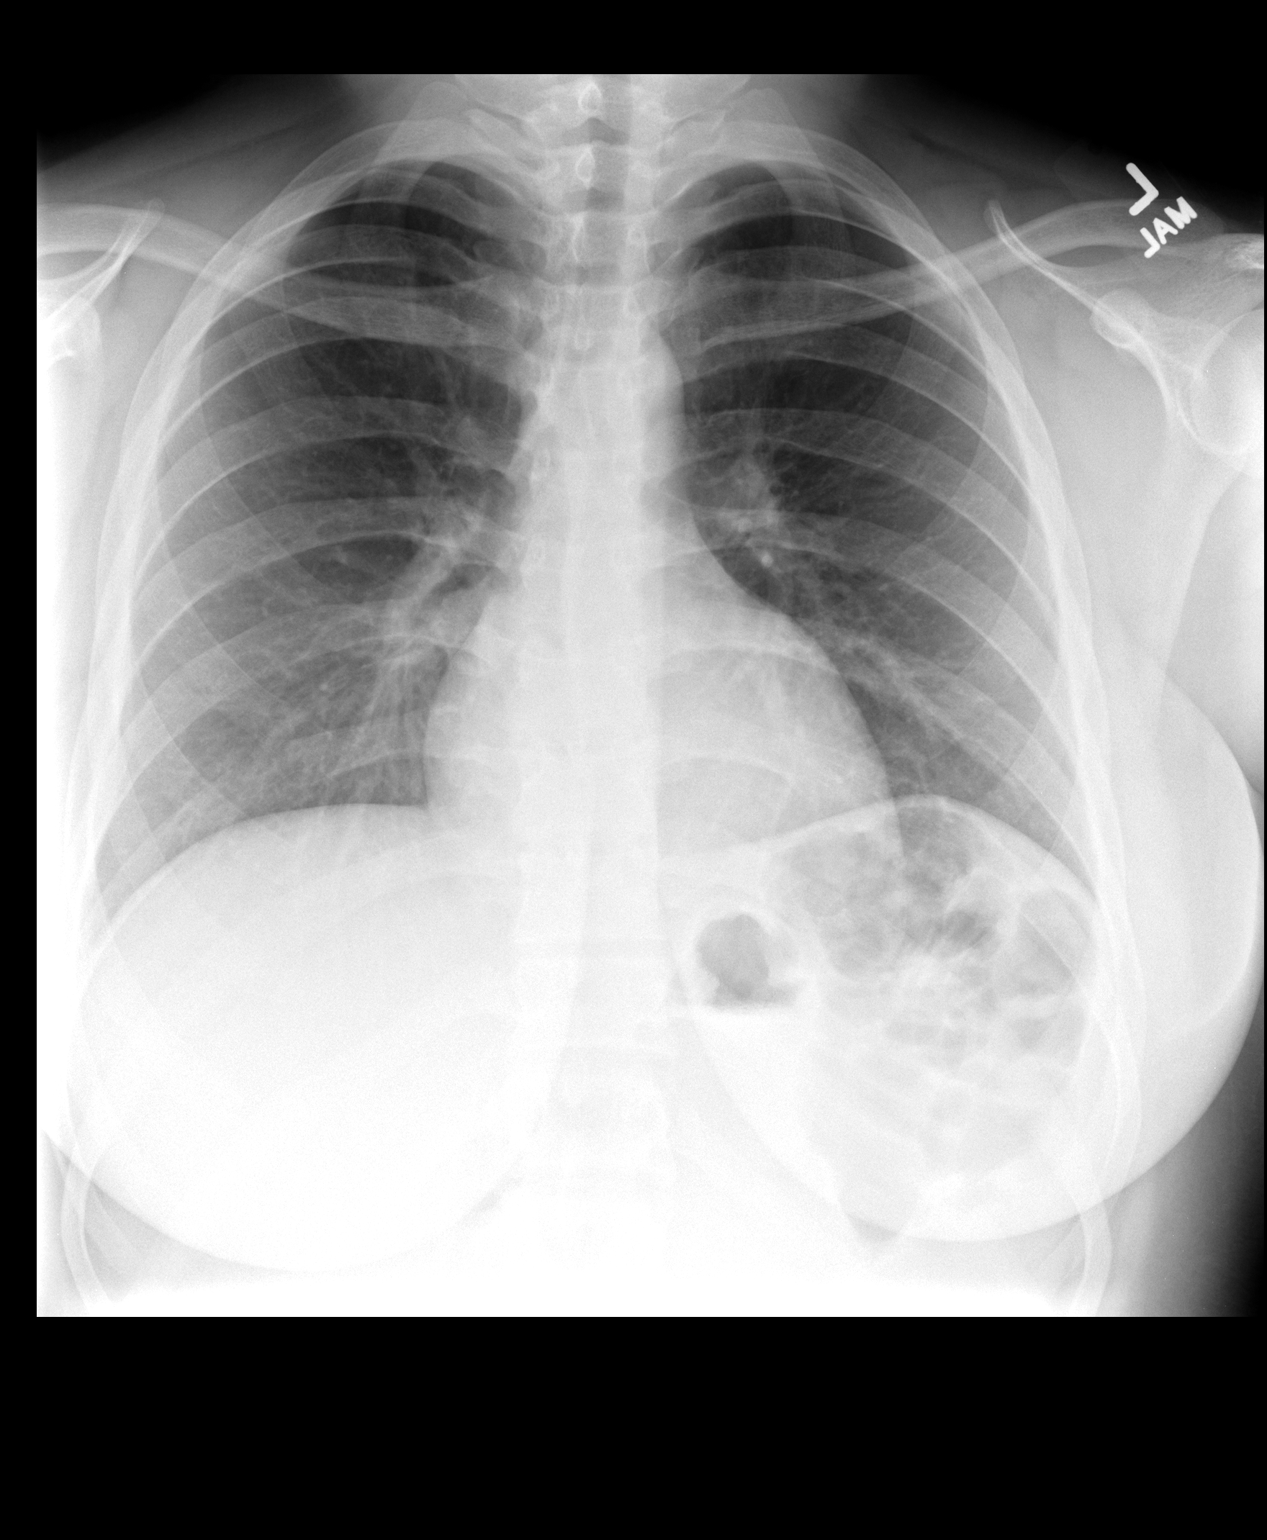

[view not recorded (2 of 2)]
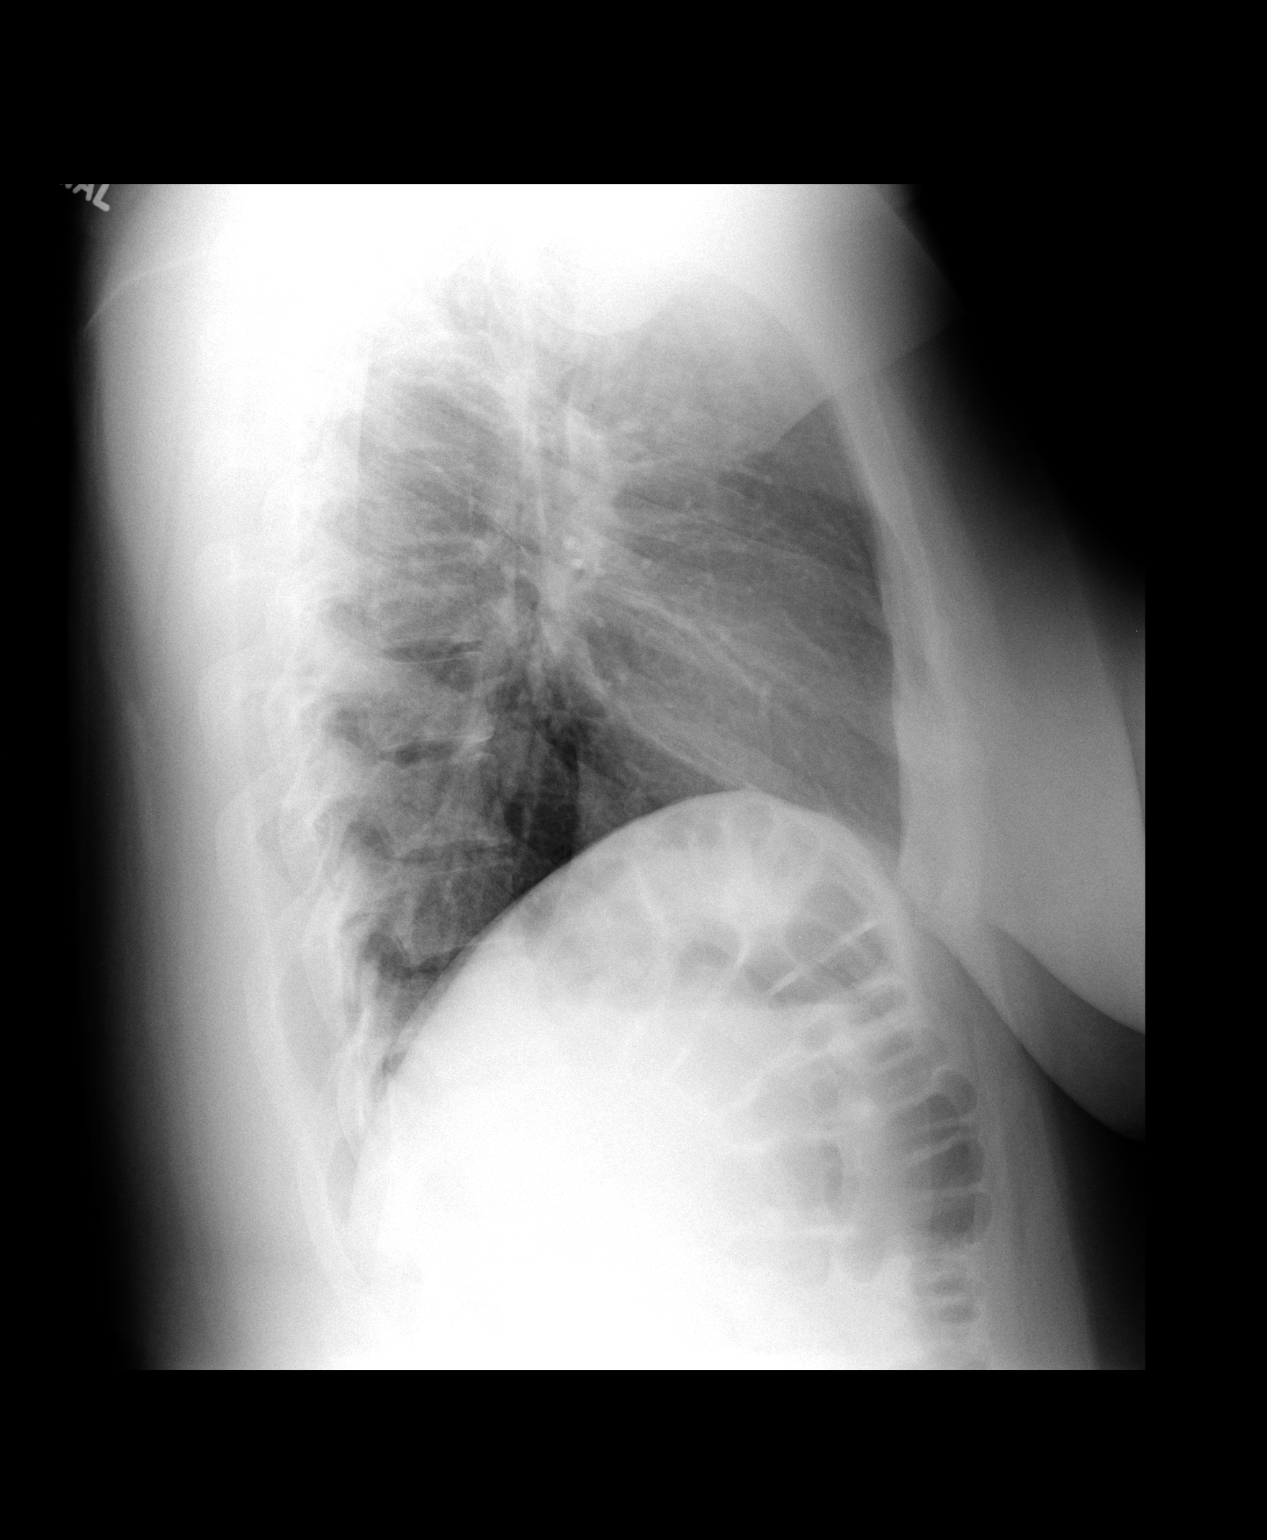

[2 of 2 positions shown; findings below may reference images not displayed]

FINDINGS: Lungs are clear. Heart size and pulmonary vascularity are normal. No
adenopathy. No bone lesions.
IMPRESSION: No abnormality noted.

## 2015-11-24 ENCOUNTER — Other Ambulatory Visit: Payer: Self-pay | Admitting: Medical

## 2015-11-25 NOTE — Telephone Encounter (Signed)
Is this ok to refill?  

## 2015-11-25 NOTE — Telephone Encounter (Signed)
Needs physical appt 

## 2015-12-21 ENCOUNTER — Other Ambulatory Visit: Payer: Self-pay | Admitting: Medical

## 2015-12-25 ENCOUNTER — Other Ambulatory Visit: Payer: Self-pay | Admitting: Medical

## 2015-12-27 ENCOUNTER — Telehealth: Payer: Self-pay | Admitting: Medical

## 2015-12-27 MED ORDER — NORGESTIM-ETH ESTRAD TRIPHASIC 0.18/0.215/0.25 MG-35 MCG PO TABS: 1.0000 | ORAL_TABLET | Freq: Every day | ORAL | Status: DC

## 2015-12-27 NOTE — Telephone Encounter (Signed)
Received message that birth control Rx was denied. She did make appointment for 01/16/16. She can not come in any earlier because she is at school and doesn't get back in town until 01/16/16.  Please refill BCP Rx to pharmacy on file in [place], [place]

## 2015-12-27 NOTE — Telephone Encounter (Signed)
Refilled to last until appt 

## 2016-01-16 ENCOUNTER — Ambulatory Visit (INDEPENDENT_AMBULATORY_CARE_PROVIDER_SITE_OTHER): Payer: BLUE CROSS/BLUE SHIELD | Admitting: Medical

## 2016-01-16 ENCOUNTER — Encounter: Payer: Self-pay | Admitting: Medical

## 2016-01-16 ENCOUNTER — Other Ambulatory Visit (HOSPITAL_COMMUNITY)
Admission: RE | Admit: 2016-01-16 | Discharge: 2016-01-16 | Disposition: A | Discharge status: Home / Self Care | Payer: BLUE CROSS/BLUE SHIELD | Source: Ambulatory Visit | Attending: Medical | Admitting: Medical

## 2016-01-16 VITALS — BP 124/82 | HR 83 | Ht 68.0 in | Wt 248.0 lb

## 2016-01-16 DIAGNOSIS — E669 Obesity, unspecified: Secondary | ICD-10-CM

## 2016-01-16 DIAGNOSIS — F341 Dysthymic disorder: Secondary | ICD-10-CM

## 2016-01-16 DIAGNOSIS — Z0000 Encounter for general adult medical examination without abnormal findings: Secondary | ICD-10-CM | POA: Insufficient documentation

## 2016-01-16 DIAGNOSIS — Z01419 Encounter for gynecological examination (general) (routine) without abnormal findings: Secondary | ICD-10-CM | POA: Insufficient documentation

## 2016-01-16 DIAGNOSIS — Z124 Encounter for screening for malignant neoplasm of cervix: Secondary | ICD-10-CM | POA: Diagnosis not present

## 2016-01-16 DIAGNOSIS — Z23 Encounter for immunization: Secondary | ICD-10-CM

## 2016-01-16 DIAGNOSIS — Z113 Encounter for screening for infections with a predominantly sexual mode of transmission: Secondary | ICD-10-CM | POA: Diagnosis not present

## 2016-01-16 DIAGNOSIS — L732 Hidradenitis suppurativa: Secondary | ICD-10-CM | POA: Diagnosis not present

## 2016-01-16 DIAGNOSIS — E049 Nontoxic goiter, unspecified: Secondary | ICD-10-CM | POA: Diagnosis not present

## 2016-01-16 HISTORY — DX: Encounter for general adult medical examination without abnormal findings: Z00.00

## 2016-01-16 HISTORY — DX: Encounter for screening for malignant neoplasm of cervix: Z12.4

## 2016-01-16 HISTORY — DX: Dysthymic disorder: F34.1

## 2016-01-16 LAB — POCT URINALYSIS DIPSTICK
Bilirubin, UA: NEGATIVE
GLUCOSE UA: NEGATIVE
Ketones, UA: NEGATIVE
Leukocytes, UA: NEGATIVE
Nitrite, UA: NEGATIVE
Protein, UA: NEGATIVE
RBC UA: NEGATIVE
Urobilinogen, UA: 0.2
pH, UA: 6

## 2016-01-16 LAB — CBC
HCT: 42.8 % (ref 35.0–45.0)
Hemoglobin: 14.2 g/dL (ref 11.7–15.5)
MCH: 29.3 pg (ref 27.0–33.0)
MCHC: 33.2 g/dL (ref 32.0–36.0)
MCV: 88.2 fL (ref 80.0–100.0)
MPV: 11.2 fL (ref 7.5–12.5)
Platelets: 375 10*3/uL (ref 140–400)
RBC: 4.85 MIL/uL (ref 3.80–5.10)
RDW: 13.2 % (ref 11.0–15.0)
WBC: 5.2 10*3/uL (ref 4.0–10.5)

## 2016-01-16 LAB — HEMOGLOBIN A1C
Hgb A1c MFr Bld: 5.2 % (ref <5.7)
Mean Plasma Glucose: 103 mg/dL

## 2016-01-16 LAB — POCT URINE PREGNANCY: Preg Test, Ur: NEGATIVE

## 2016-01-16 LAB — TSH: TSH: 2.22 m[IU]/L

## 2016-01-16 LAB — T4, FREE: Free T4: 1.1 ng/dL (ref 0.8–1.8)

## 2016-01-16 MED ORDER — NORGESTIM-ETH ESTRAD TRIPHASIC 0.18/0.215/0.25 MG-35 MCG PO TABS: 1.0000 | ORAL_TABLET | Freq: Every day | ORAL | Status: DC

## 2016-01-16 NOTE — Progress Notes (Signed)
Subjective:   HPI  Brandi Manning is a 20 y.o. female who presents for a complete physical.   Concerns: Dysthymia - still seeing counseling every 2 weeks while in school.  Mood has been ok, still sometimes down in mood.  Still taking Prozac regularly.  No SI, no HI.  No other aggravating or relieving factors.   Gyn hx/o - no prior pregnancies.   Not currently sexually active but has been in last year.   Has regular but sometimes heavy period.  Wants to c/t OCP and has done fine on this.     Reviewed their medical, surgical, family, social, medication, and allergy history and updated chart as appropriate.  Review of Systems Constitutional: -fever, -chills, -sweats, -unexpected weight change, -decreased appetite, -fatigue Allergy: -sneezing, -itching, -congestion Dermatology: -changing moles, --rash, -lumps ENT: -runny nose, -ear pain, -sore throat, -hoarseness, -sinus pain, -teeth pain, - ringing in ears, -hearing loss, -nosebleeds Cardiology: -chest pain, -palpitations, -swelling, -difficulty breathing when lying flat, -waking up short of breath Respiratory: -cough, -shortness of breath, -difficulty breathing with exercise or exertion, -wheezing, -coughing up blood Gastroenterology: -abdominal pain, -nausea, -vomiting, -diarrhea, -constipation, -blood in stool, -changes in bowel movement, -difficulty swallowing or eating Hematology: -bleeding, -bruising  Musculoskeletal: -joint aches, -muscle aches, -joint swelling, -back pain, -neck pain, -cramping, -changes in gait Ophthalmology: denies vision changes, eye redness, itching, discharge Urology: -burning with urination, -difficulty urinating, -blood in urine, -urinary frequency, -urgency, -incontinence Neurology: -headache, -weakness, -tingling, -numbness, -memory loss, -falls, -dizziness Psychology: +depressed mood, -agitation, -sleep problems  Past Medical History  Diagnosis Date  . Wears glasses   . Headache     intermittent  .  Allergy     seasonal    Past Surgical History  Procedure Laterality Date  . Wisdom tooth extraction      Social History   Social History  . Marital Status: Single    Spouse Name: N/A  . Number of Children: N/A  . Years of Education: N/A   Occupational History  . Not on file.   Social History Main Topics  . Smoking status: Never Smoker   . Smokeless tobacco: Not on file  . Alcohol Use: No  . Drug Use: No  . Sexual Activity: No   Other Topics Concern  . Not on file   Social History Narrative   [employer].  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   Exercise - sometimes, walking, diet is "poor."   In the environmental club.  No significant other as of 01/2016.    Family History  Problem Relation Age of Onset  . Hypertension Mother   . Arthritis Mother   . Heart disease Mother     ?  . Diabetes Maternal Grandmother   . Heart disease Paternal Grandfather      Current outpatient prescriptions:  .  FLUoxetine (PROZAC) 20 MG tablet, Take 1 tablet (20 mg total) by mouth daily., Disp: 30 tablet, Rfl: 1 .  Norgestimate-Ethinyl Estradiol Triphasic (TRI-PREVIFEM) 0.18/0.215/0.25 MG-35 MCG tablet, Take 1 tablet by mouth daily., Disp: 28 tablet, Rfl: 11  No Known Allergies       Objective:   Physical Exam  Filed Vitals:   01/16/16 1005  BP: 124/82  Pulse: 83   Wt Readings from Last 3 Encounters:  01/16/16 248 lb (112.492 kg)  08/26/15 247 lb (112.038 kg)  01/31/15 237 lb (107.502 kg)   General appearance: alert, no distress, WD/WN, obese AA female Skin:  no worrisome lesions HEENT: normocephalic, conjunctiva/corneas normal, sclerae anicteric, PERRLA, EOMi, nares patent, no discharge or erythema, pharynx normal Oral cavity: MMM, tongue normal, teeth normal Neck: supple, no lymphadenopathy, +goiter mild,no distinct nodule, no masses, normal ROM Chest: non tender, normal shape and expansion Heart: RRR, normal S1, S2,  no murmurs Lungs: CTA bilaterally, no wheezes, rhonchi, or rales Abdomen: +bs, soft, non tender, non distended, no masses, no hepatomegaly, no splenomegaly, no bruits Back: non tender, normal ROM, no scoliosis Musculoskeletal: upper extremities non tender, no obvious deformity, normal ROM throughout, lower extremities non tender, no obvious deformity, normal ROM throughout Extremities: no edema, no cyanosis, no clubbing Pulses: 2+ symmetric, upper and lower extremities, normal cap refill Neurological: alert, oriented x 3, CN2-12 intact, strength normal upper extremities and lower extremities, sensation normal throughout, DTRs 2+ throughout, no cerebellar signs, gait normal Psychiatric: pleasant, good eye contact, answered questions appropriately Breast: deferred Gyn: Normal external genitalia without lesions, vagina with normal mucosa, cervix without lesions, no cervical motion tenderness, no abnormal vaginal discharge.  Uterus and adnexa not enlarged, non tender, no masses.  Pap taken.  Exam chaperoned by nurse. Rectal: deferred   Assessment and Plan :    Encounter Diagnoses  Name Primary?  . Encounter for health maintenance examination in adult Yes  . Goiter   . Obesity   . Hidradenitis axillaris   . Dysthymic disorder   . Need for HPV vaccination   . Need for meningococcal vaccination   . Screening for cervical cancer   . Screen for STD (sexually transmitted disease)     Physical exam - discussed healthy lifestyle, diet, exercise, preventative care, vaccinations, and addressed their concerns.  Routine labs today.  See your eye doctor yearly for routine vision care.  See your dentist yearly for routine dental care including hygiene visits twice yearly.  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    Hidradenitis - hasn't been an issue of late  Goiter - labs today  HPV vaccine - Counseled on the Human Papilloma virus vaccine.   Vaccine information sheet given.  HPV vaccine given after consent obtained.    Counseled on the meningococcal vaccine.  Vaccine information sheet given.  Meningococcal vaccine Bexsero #1 given after consent obtained.  Advised to return in 1 month for the final Bexsero vaccine.  Obesity - discussed diet, exercise, healthy habits  Contraception - We discussed risks of OCPs including headache, nausea, breast tenderness, irregular bleeding or spotting, possible risks of blood clots, heart attack, stroke.     Discussed advantages of OCPs including possible decreased premenstrual symptoms, improving cramps, regulating cycles, decreasing heavy flow.  Discussed proper use of medication.  She understands the benefits, risks, and wishes to continue OCPs. Urine pregnancy negative.    Discussed safe sex, STD prevention, condom use.     1st pap sent.  Follow-up pending labs  Makenli was seen today for annual exam.  Diagnoses and all orders for this visit:  Encounter for health maintenance examination in adult -     TSH -     CBC -     Hemoglobin A1c -     HIV antibody -     RPR -     Cytology - PAP -     T4, free -     POCT urine pregnancy -     POCT urinalysis dipstick  Goiter -     TSH -     T4, free  Obesity -     Hemoglobin A1c  Hidradenitis axillaris  Dysthymic disorder  Need for HPV vaccination -     HPV 9-valent vaccine,Recombinat  Need for meningococcal vaccination -     Meningococcal B, OMV  Screening for cervical cancer -     Cytology - PAP  Screen for STD (sexually transmitted disease) -     HIV antibody -     RPR -     Cytology - PAP  Other orders -     Norgestimate-Ethinyl Estradiol Triphasic (TRI-PREVIFEM) 0.18/0.215/0.25 MG-35 MCG tablet; Take 1 tablet by mouth daily.

## 2016-01-17 LAB — HIV ANTIBODY (ROUTINE TESTING W REFLEX): HIV 1&2 Ab, 4th Generation: NONREACTIVE

## 2016-01-17 LAB — RPR

## 2016-01-20 LAB — CYTOLOGY - PAP

## 2016-04-17 ENCOUNTER — Ambulatory Visit (INDEPENDENT_AMBULATORY_CARE_PROVIDER_SITE_OTHER): Payer: BLUE CROSS/BLUE SHIELD | Admitting: Medical

## 2016-04-17 ENCOUNTER — Encounter: Payer: Self-pay | Admitting: Medical

## 2016-04-17 VITALS — BP 110/80 | HR 68 | Wt 247.0 lb

## 2016-04-17 DIAGNOSIS — Z62820 Parent-biological child conflict: Secondary | ICD-10-CM

## 2016-04-17 DIAGNOSIS — F329 Major depressive disorder, single episode, unspecified: Secondary | ICD-10-CM | POA: Diagnosis not present

## 2016-04-17 DIAGNOSIS — Z7189 Other specified counseling: Secondary | ICD-10-CM | POA: Diagnosis not present

## 2016-04-17 DIAGNOSIS — F32A Depression, unspecified: Secondary | ICD-10-CM | POA: Insufficient documentation

## 2016-04-17 HISTORY — DX: Depression, unspecified: F32.A

## 2016-04-17 HISTORY — DX: Parent-biological child conflict: Z62.820

## 2016-04-17 MED ORDER — VORTIOXETINE HBR 10 MG PO TABS: 1.0000 | ORAL_TABLET | Freq: Every day | ORAL | 2 refills | Status: DC

## 2016-04-17 NOTE — Progress Notes (Signed)
Subjective: Chief Complaint  Patient presents with  . Depression    states that she feels that she is getting worse as if the medication is not working as it did before. feeling tired a lot more so is taking medication at night to try and help with that   Here for f/u on depression.   Feels like the medication isn't working as good as it has prior.   Taking Prozac 20mg  for last several months, failed citalopram prior.  Medication makes her sleepy in the daytime so switched to night time.   Feeling more down of late.   Goes back to school next week and plans to restart counseling there.     She notes some issues with father recently, arguing with her about money and school.  This really upset her, made her feel down, thoughts of harm without plans.  Father told her she was adult and didn't need his help now.   She lives with mother.  Father is incarcerated in [place], [place].   Sees father about twice a month.  She doesn't like visit him as he is in correctional facility.    Father got married in the facility recently.  She is entering her Junior year.   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 No other aggravating or relieving factors. No other complaint.  Past Medical History:  Diagnosis Date  . Allergy    seasonal  . Headache    intermittent  . Wears glasses    Current Outpatient Prescriptions on File Prior to Visit  Medication Sig Dispense Refill  . FLUoxetine (PROZAC) 20 MG tablet Take 1 tablet (20 mg total) by mouth daily. 30 tablet 1  . Norgestimate-Ethinyl Estradiol Triphasic (TRI-PREVIFEM) 0.18/0.215/0.25 MG-35 MCG tablet Take 1 tablet by mouth daily. 28 tablet 11   No current facility-administered medications on file prior to visit.    ROS as in subjective    Objective: BP 110/80   Pulse 68   Wt 247 lb (112 kg)   LMP 04/17/2016   BMI 37.56 kg/m   Gen: wd, wn, nad Psych: pleasant, good eye contact, answered questions appropriately   Assessment:  Encounter  Diagnoses  Name Primary?  . Depression Yes  . Relationship problem with parent     Plan: She has failed Prozac and citalopram.  Begin trial of Tintellix.  Advised she begin 5mg  tablet daily for a week, then go to 10mg  daily.   Gave script and samples.   The first week she begins low dose Trintellix, she will use Prozac every other day then stop this for a week.   Get back into counseling at school when she starts back next week.  counseled on ways to deal with mood, discussed her relationship with father, knowing that she has valid concerns, but has to guard her heart particularly with her husband who is incarcerated.  F/u with call back in 2 wk, sooner prn.   Glenola was seen today for depression.  Diagnoses and all orders for this visit:  Depression  Relationship problem with parent  Other orders -     vortioxetine HBr (TRINTELLIX) 10 MG TABS; Take 1 tablet (10 mg total) by mouth daily.

## 2016-04-20 ENCOUNTER — Telehealth: Payer: Self-pay | Admitting: Medical

## 2016-04-24 NOTE — Telephone Encounter (Signed)
P.A. Approved, [facility].  I activated discount card & [facility] and [facility] for $10.  Pt informed

## 2016-05-15 ENCOUNTER — Telehealth: Payer: Self-pay | Admitting: Medical

## 2016-05-15 NOTE — Telephone Encounter (Signed)
Pt called & stated that she went to transfer the Trintellix [facility] & it came back as $50 & she can't afford that.  I explained there was a discount card I had activated & given tot he other pharmacy.  I [facility] T[facility] [phone] & gave them the discount card and ran thru for $10

## 2016-07-29 ENCOUNTER — Encounter: Payer: Self-pay | Admitting: Medical

## 2016-07-29 ENCOUNTER — Ambulatory Visit (INDEPENDENT_AMBULATORY_CARE_PROVIDER_SITE_OTHER): Payer: BLUE CROSS/BLUE SHIELD | Admitting: Medical

## 2016-07-29 DIAGNOSIS — N898 Other specified noninflammatory disorders of vagina: Secondary | ICD-10-CM

## 2016-07-29 DIAGNOSIS — R300 Dysuria: Secondary | ICD-10-CM | POA: Insufficient documentation

## 2016-07-29 HISTORY — DX: Dysuria: R30.0

## 2016-07-29 HISTORY — DX: Other specified noninflammatory disorders of vagina: N89.8

## 2016-07-29 LAB — POCT URINALYSIS DIPSTICK
Bilirubin, UA: NEGATIVE
Blood, UA: NEGATIVE
Glucose, UA: NEGATIVE
Ketones, UA: NEGATIVE
PH UA: 6
PROTEIN UA: NEGATIVE
Spec Grav, UA: >=1.03
Urobilinogen, UA: NEGATIVE

## 2016-07-29 LAB — POCT WET PREP (WET MOUNT)
KOH Wet Prep POC: NEGATIVE
SOURCE WET PREP POC: NEGATIVE
TRICHOMONAS WET PREP HPF POC: ABSENT

## 2016-07-29 LAB — POCT URINE PREGNANCY: Preg Test, Ur: NEGATIVE

## 2016-07-29 MED ORDER — FLUCONAZOLE 150 MG PO TABS: ORAL_TABLET | ORAL | 0 refills | Status: DC

## 2016-07-29 MED ORDER — CIPROFLOXACIN HCL 500 MG PO TABS: 500.0000 mg | ORAL_TABLET | Freq: Two times a day (BID) | ORAL | 0 refills | Status: DC

## 2016-07-29 NOTE — Patient Instructions (Signed)
   Your symptoms and exam suggest urinary tract infection and possibly yeast infectoin  Begin Cipro twice daily for 3 days for urinary tract infection  Use some cranberry juice the next few days  Increase your water intake  On day 3 of the Cipro, begin Diflucan tablet for yeast infection  After 3 days if still having vaginal itching, you can use another diflucan every 3 days, until symptoms resolve  Call if any questions or if worse or not improving.

## 2016-07-29 NOTE — Progress Notes (Signed)
Subjective: Chief Complaint  Patient presents with  . yeast infection,  conjection    yeast inection and conjection    Here for vaginal concern.   She notes for 2 weeks has had intermittent irritation in the vagina. burning sensation at times.  Some itching.  No rash, no redness, no discharge, no odor. She does use body washes, has had new body wash. Using condoms.   Has had a new sexual partner though.  No fever, back or abdominal pain.  No other aggravating or relieving factors. No other complaint.  Past Medical History:  Diagnosis Date  . Allergy    seasonal  . Headache    intermittent  . Wears glasses    Current Outpatient Prescriptions on File Prior to Visit  Medication Sig Dispense Refill  . Norgestimate-Ethinyl Estradiol Triphasic (TRI-PREVIFEM) 0.18/0.215/0.25 MG-35 MCG tablet Take 1 tablet by mouth daily. 28 tablet 11  . vortioxetine HBr (TRINTELLIX) 10 MG TABS Take 1 tablet (10 mg total) by mouth daily. 30 tablet 2   No current facility-administered medications on file prior to visit.    ROS as in subjective   Objective: BP 126/80   Pulse (!) 101   Temp 98.3 F (36.8 C)   Wt 260 lb 12.8 oz (118.3 kg)   SpO2 94%   BMI 39.65 kg/m   Gen: wd, wn, nad Abdomen: nontender, no mass, no lymphadenopathy Gyn: Normal external genitalia without lesions, vagina with normal mucosa, cervix without lesions, no cervical motion tenderness, slight white vaginal discharge.  Uterus and adnexa not enlarged, non tender, no masses.  Swabs taken Exam chaperoned by nurse. Rectal: normal appearing   Assessment: Encounter Diagnoses  Name Primary?  . Vaginal irritation Yes  . Dysuria     Plan: Discussed symptoms, exam findings, possible causes.  Keep soap and hygiene products consistent.   Patient Instructions   Your symptoms and exam suggest urinary tract infection and possibly yeast infectoin  Begin Cipro twice daily for 3 days for urinary tract infection  Use some cranberry  juice the next few days  Increase your water intake  On day 3 of the Cipro, begin Diflucan tablet for yeast infection  After 3 days if still having vaginal itching, you can use another diflucan every 3 days, until symptoms resolve  Call if any questions or if worse or not improving.   Kenia was seen today for yeast infection,  conjection.  Diagnoses and all orders for this visit:  Vaginal irritation -     POCT Wet Prep [facility] -     POCT urinalysis dipstick -     POCT urine pregnancy -     GC/Chlamydia Probe Amp  Dysuria -     POCT Wet Prep [facility] [place] -     POCT urinalysis dipstick -     POCT urine pregnancy -     GC/Chlamydia Probe Amp -     Urine culture  Other orders -     fluconazole (DIFLUCAN) 150 MG tablet; 1 tablet every other day -     ciprofloxacin (CIPRO) 500 MG tablet; Take 1 tablet (500 mg total) by mouth 2 (two) times daily.

## 2016-07-30 LAB — GC/CHLAMYDIA PROBE AMP
CT Probe RNA: NOT DETECTED
GC Probe RNA: NOT DETECTED

## 2016-10-30 ENCOUNTER — Other Ambulatory Visit: Payer: Self-pay | Admitting: Medical

## 2016-10-30 NOTE — Telephone Encounter (Signed)
Pt will be due in may for cpe

## 2016-10-31 DIAGNOSIS — F419 Anxiety disorder, unspecified: Secondary | ICD-10-CM | POA: Diagnosis not present

## 2016-11-01 DIAGNOSIS — F419 Anxiety disorder, unspecified: Secondary | ICD-10-CM | POA: Diagnosis not present

## 2016-11-05 DIAGNOSIS — F419 Anxiety disorder, unspecified: Secondary | ICD-10-CM | POA: Diagnosis not present

## 2016-11-26 ENCOUNTER — Telehealth: Payer: Self-pay

## 2016-11-26 MED ORDER — NORGESTIM-ETH ESTRAD TRIPHASIC 0.18/0.215/0.25 MG-35 MCG PO TABS: 1.0000 | ORAL_TABLET | Freq: Every day | ORAL | 2 refills | Status: DC

## 2016-11-26 NOTE — Telephone Encounter (Signed)
rx renewed. /RLB 

## 2016-11-26 NOTE — Telephone Encounter (Signed)
She should have refill through 01/2017, so not sure why we are getting request, but send refill or clarify with pharmacy

## 2016-11-26 NOTE — Telephone Encounter (Signed)
[facility]axed [facility]

## 2017-01-08 ENCOUNTER — Ambulatory Visit (INDEPENDENT_AMBULATORY_CARE_PROVIDER_SITE_OTHER): Payer: BLUE CROSS/BLUE SHIELD | Admitting: Medical

## 2017-01-08 ENCOUNTER — Encounter: Payer: Self-pay | Admitting: Medical

## 2017-01-08 ENCOUNTER — Other Ambulatory Visit: Payer: Self-pay

## 2017-01-08 ENCOUNTER — Telehealth: Payer: Self-pay | Admitting: Medical

## 2017-01-08 VITALS — BP 126/80 | HR 86 | Ht 68.0 in | Wt 270.0 lb

## 2017-01-08 DIAGNOSIS — Z309 Encounter for contraceptive management, unspecified: Secondary | ICD-10-CM

## 2017-01-08 DIAGNOSIS — L989 Disorder of the skin and subcutaneous tissue, unspecified: Secondary | ICD-10-CM | POA: Insufficient documentation

## 2017-01-08 DIAGNOSIS — T148XXA Other injury of unspecified body region, initial encounter: Secondary | ICD-10-CM | POA: Diagnosis not present

## 2017-01-08 HISTORY — DX: Encounter for contraceptive management, unspecified: Z30.9

## 2017-01-08 HISTORY — DX: Other injury of unspecified body region, initial encounter: T14.8XXA

## 2017-01-08 HISTORY — DX: Disorder of the skin and subcutaneous tissue, unspecified: L98.9

## 2017-01-08 MED ORDER — NORGESTIM-ETH ESTRAD TRIPHASIC 0.18/0.215/0.25 MG-35 MCG PO TABS: 1.0000 | ORAL_TABLET | Freq: Every day | ORAL | 2 refills | Status: DC

## 2017-01-08 NOTE — Telephone Encounter (Signed)
Refer to Deb at [facility]  I'd like to refer them to Brandi Manning, wellness nurse at [facility] for a consult about diet, exercise, wellness.  I think this would be a great opportunity and resource to help them meet wellness goals.   Providers Referral Exercise Program Brandi Manning, Wellness RN at the [facility]  [address], [place], [place] [id number] [phone] Brandi Manning

## 2017-01-08 NOTE — Telephone Encounter (Signed)
Sent referral to [facility].

## 2017-01-08 NOTE — Progress Notes (Signed)
Subjective: Chief Complaint  Patient presents with  . Blister    bottom of left foot   Here for skin blister left foot.  Did a lot of walking [place] the beach recently, formed a blister about 6 days ago.   Not going away.  Using nothing on the blister.     Also needs refill on OCP.  Not sexually active, no vaginal concerns,, periods regular.  Wants to c/t OCP though in case she is sexually active and to help regulate periods.    Has questions about nutrition, diet, wants to see nutritionist.  Past Medical History:  Diagnosis Date  . Allergy    seasonal  . Headache    intermittent  . Wears glasses    Current Outpatient Prescriptions on File Prior to Visit  Medication Sig Dispense Refill  . vortioxetine HBr (TRINTELLIX) 10 MG TABS Take 1 tablet (10 mg total) by mouth daily. 30 tablet 2   No current facility-administered medications on file prior to visit.    ROS as in subjective   Objective: BP 126/80   Pulse 86   Ht 5\' 8"  (1.727 m)   Wt 270 lb (122.5 kg)   SpO2 98%   BMI 41.05 kg/m   Wt Readings from Last 3 Encounters:  01/08/17 270 lb (122.5 kg)  07/29/16 260 lb 12.8 oz (118.3 kg)  04/17/16 247 lb (112 kg)    gen: wd, wn, nad, obese, AA female Skin: left volar foot over 4th and 5th MTP with 2cm flat bullous /blister lesion, but no erythema, no warmth, no induration    Assessment: Encounter Diagnoses  Name Primary?  . Blister Yes  . Skin lesion   . Encounter for contraceptive management, unspecified type   . Morbid obesity (HCC)     Plan: Blister, skin lesion - benign.  Reassured that is should heal within the next several days.  Keep clean with soap and water, avoid puncturing the blister on purpose, if any worse signs of infection, pain, redness, then call back  contraception - reviewed her pap here from last year.  She declines STD tests as she has had no new sexual partners since last visit here.  Abstinent of late.   We discussed her interest in c/t oral  hormonal contraception.  Discussed treatment options, types of OCPs, various other methods available.  Discussed non hormonal contraception as well.  We discussed risks of OCPs including headache, nausea, breast tenderness, irregular bleeding or spotting, possible risks of blood clots, heart attack, stroke.     Discussed advantages of OCPs including possible decreased premenstrual symptoms, improving cramps, regulating cycles, decreasing heavy flow.  Discussed proper use of medication.  She understands the benefits, risks, and wishes c/t same OCPs.  F/u soon for physical, pelvic exam.  Discussed safe sex, STD prevention, condom use.     Morbid obesity - counseled on need for diet and exercise changes.  Refer to PREP exercise and nutrition program at [facility]  Brandi Manning was seen today for blister.  Diagnoses and all orders for this visit:  Blister  Skin lesion  Encounter for contraceptive management, unspecified type  Morbid obesity (HCC)  Other orders -     Norgestimate-Ethinyl Estradiol Triphasic (TRI-PREVIFEM) 0.18/0.215/0.25 MG-35 MCG tablet; Take 1 tablet by mouth daily.

## 2017-03-19 ENCOUNTER — Encounter: Payer: Self-pay | Admitting: Medical

## 2017-03-19 ENCOUNTER — Encounter: Payer: BLUE CROSS/BLUE SHIELD | Admitting: Medical

## 2017-03-19 ENCOUNTER — Other Ambulatory Visit: Payer: Self-pay | Admitting: Medical

## 2017-03-19 ENCOUNTER — Ambulatory Visit (INDEPENDENT_AMBULATORY_CARE_PROVIDER_SITE_OTHER): Payer: BLUE CROSS/BLUE SHIELD | Admitting: Medical

## 2017-03-19 VITALS — BP 114/68 | HR 93 | Ht 68.0 in | Wt 270.6 lb

## 2017-03-19 DIAGNOSIS — Z113 Encounter for screening for infections with a predominantly sexual mode of transmission: Secondary | ICD-10-CM

## 2017-03-19 DIAGNOSIS — Z0000 Encounter for general adult medical examination without abnormal findings: Secondary | ICD-10-CM | POA: Diagnosis not present

## 2017-03-19 DIAGNOSIS — R51 Headache: Secondary | ICD-10-CM | POA: Diagnosis not present

## 2017-03-19 DIAGNOSIS — Z3040 Encounter for surveillance of contraceptives, unspecified: Secondary | ICD-10-CM

## 2017-03-19 DIAGNOSIS — R0683 Snoring: Secondary | ICD-10-CM

## 2017-03-19 DIAGNOSIS — R109 Unspecified abdominal pain: Secondary | ICD-10-CM

## 2017-03-19 DIAGNOSIS — E049 Nontoxic goiter, unspecified: Secondary | ICD-10-CM | POA: Diagnosis not present

## 2017-03-19 DIAGNOSIS — F341 Dysthymic disorder: Secondary | ICD-10-CM | POA: Diagnosis not present

## 2017-03-19 DIAGNOSIS — R519 Headache, unspecified: Secondary | ICD-10-CM

## 2017-03-19 HISTORY — DX: Headache, unspecified: R51.9

## 2017-03-19 HISTORY — DX: Unspecified abdominal pain: R10.9

## 2017-03-19 HISTORY — DX: Snoring: R06.83

## 2017-03-19 LAB — POCT URINALYSIS DIP (PROADVANTAGE DEVICE)
BILIRUBIN UA: NEGATIVE mg/dL
Bilirubin, UA: NEGATIVE
Glucose, UA: NEGATIVE mg/dL
Leukocytes, UA: NEGATIVE
Nitrite, UA: NEGATIVE
PH UA: 6 (ref 5.0–8.0)
PROTEIN UA: NEGATIVE mg/dL
RBC UA: NEGATIVE
SPECIFIC GRAVITY, URINE: 1.03
UUROB: NEGATIVE

## 2017-03-19 LAB — CBC
HCT: 41.9 % (ref 35.0–45.0)
Hemoglobin: 13.9 g/dL (ref 11.7–15.5)
MCH: 29.1 pg (ref 27.0–33.0)
MCHC: 33.2 g/dL (ref 32.0–36.0)
MCV: 87.7 fL (ref 80.0–100.0)
MPV: 10.8 fL (ref 7.5–12.5)
Platelets: 446 10*3/uL — ABNORMAL HIGH (ref 140–400)
RBC: 4.78 MIL/uL (ref 3.80–5.10)
RDW: 13.6 % (ref 11.0–15.0)
WBC: 5.5 10*3/uL (ref 4.0–10.5)

## 2017-03-19 LAB — COMPREHENSIVE METABOLIC PANEL
ALBUMIN: 4.5 g/dL (ref 3.6–5.1)
ALT: 8 U/L (ref 6–29)
AST: 16 U/L (ref 10–30)
Alkaline Phosphatase: 78 U/L (ref 33–115)
BUN: 11 mg/dL (ref 7–25)
CO2: 24 mmol/L (ref 20–31)
CREATININE: 1.03 mg/dL (ref 0.50–1.10)
Calcium: 9.8 mg/dL (ref 8.6–10.2)
Chloride: 105 mmol/L (ref 98–110)
Glucose, Bld: 77 mg/dL (ref 65–99)
POTASSIUM: 4.1 mmol/L (ref 3.5–5.3)
SODIUM: 139 mmol/L (ref 135–146)
Total Bilirubin: 0.4 mg/dL (ref 0.2–1.2)
Total Protein: 7.3 g/dL (ref 6.1–8.1)

## 2017-03-19 LAB — TSH: TSH: 2.06 m[IU]/L

## 2017-03-19 LAB — POCT URINE PREGNANCY: Preg Test, Ur: NEGATIVE

## 2017-03-19 MED ORDER — VORTIOXETINE HBR 10 MG PO TABS: 1.0000 | ORAL_TABLET | Freq: Every day | ORAL | 1 refills | Status: DC

## 2017-03-19 NOTE — Progress Notes (Signed)
Subjective:   HPI  Brandi Manning is a 21 y.o. female who presents for a complete physical.   Chief Complaint  Patient presents with  . Annual Exam    physical , stomach pain     Concerns: Dysthymia - still seeing counseling, currently taking Trintiellix.  Doing ok on this.  No SI, no HI.  No other aggravating or relieving factors.   Gyn hx/o - no prior pregnancies.   Not currently sexually active but has been in last year.   Periods regular, not heavy.  Wants to c/t OCP and has done fine on this.     Lately been having some general abdominal discomfort,intermittent, not related to food or activity.  Has occasional nausea, but no vomiting, no stool changes, no constipation or diarrhea,   Having headaches, mostly first thing in the mornings, frontal.  Does snore, no witnessed apnea.   Gets 2-3 headaches per week.  No paresthesias.  Not drinking lots of caffeine, has consistent sleep, no allergy problems.     Reviewed their medical, surgical, family, social, medication, and allergy history and updated chart as appropriate.  Review of Systems Constitutional: -fever, -chills, -sweats, -unexpected weight change, -decreased appetite, -fatigue Allergy: -sneezing, -itching, -congestion Dermatology: -changing moles, --rash, -lumps ENT: -runny nose, -ear pain, -sore throat, -hoarseness, -sinus pain, -teeth pain, - ringing in ears, -hearing loss, -nosebleeds Cardiology: -chest pain, -palpitations, -swelling, -difficulty breathing when lying flat, -waking up short of breath Respiratory: -cough, -shortness of breath, -difficulty breathing with exercise or exertion, -wheezing, -coughing up blood Gastroenterology: -abdominal pain, -nausea, -vomiting, -diarrhea, -constipation, -blood in stool, -changes in bowel movement, -difficulty swallowing or eating Hematology: -bleeding, -bruising  Musculoskeletal: -joint aches, -muscle aches, -joint swelling, -back pain, -neck pain, -cramping, -changes in  gait Ophthalmology: denies vision changes, eye redness, itching, discharge Urology: -burning with urination, -difficulty urinating, -blood in urine, -urinary frequency, -urgency, -incontinence Neurology: -headache, -weakness, -tingling, -numbness, -memory loss, -falls, -dizziness Psychology: -depressed mood, -agitation, -sleep problems  Past Medical History:  Diagnosis Date  . Allergy    seasonal  . Headache    intermittent  . Wears glasses     Past Surgical History:  Procedure Laterality Date  . WISDOM TOOTH EXTRACTION      Social History   Social History  . Marital status: Single    Spouse name: N/A  . Number of children: N/A  . Years of education: N/A   Occupational History  . Not on file.   Social History Main Topics  . Smoking status: Never Smoker  . Smokeless tobacco: Never Used  . Alcohol use Yes     Comment: occ  . Drug use: No  . Sexual activity: No   Other Topics Concern  . Not on file   Social History Narrative   [employer].  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   Exercise - sometimes, walking, diet is "poor."   In the environmental club.  No significant other as of 01/2016.    Family History  Problem Relation Age of Onset  . Hypertension Mother   . Arthritis Mother   . Heart disease Mother        ?  . Diabetes Maternal Grandmother   . Heart disease Paternal Grandfather      Current Outpatient Prescriptions:  .  Norgestimate-Ethinyl Estradiol Triphasic (TRI-PREVIFEM) 0.18/0.215/0.25 MG-35 MCG tablet, Take 1 tablet by mouth daily., Disp: 28 tablet, Rfl: 2 .  vortioxetine HBr (TRINTELLIX) 10  MG TABS, Take 1 tablet (10 mg total) by mouth daily., Disp: 90 tablet, Rfl: 1 .  TRI-PREVIFEM 0.18/0.215/0.25 MG-35 MCG tablet, TAKE 1 TABLET BY MOUTH DAILY., Disp: 28 tablet, Rfl: 11  No Known Allergies       Objective:   Physical Exam  BP 114/68   Pulse 93   Ht 5\' 8"  (1.727 m)   Wt 270 lb 9.6 oz  (122.7 kg)   SpO2 97%   BMI 41.14 kg/m   Wt Readings from Last 3 Encounters:  03/19/17 270 lb 9.6 oz (122.7 kg)  01/08/17 270 lb (122.5 kg)  07/29/16 260 lb 12.8 oz (118.3 kg)   General appearance: alert, no distress, WD/WN, obese AA female Skin: no worrisome lesions HEENT: normocephalic, conjunctiva/corneas normal, sclerae anicteric, PERRLA, EOMi, nares patent, no discharge or erythema, pharynx normal Oral cavity: MMM, tongue normal, teeth normal Neck: supple, no lymphadenopathy, +goiter mild,no distinct nodule, no masses, normal ROM Chest: non tender, normal shape and expansion Heart: RRR, normal S1, S2, no murmurs Lungs: CTA bilaterally, no wheezes, rhonchi, or rales Abdomen: +bs, soft, mild generalized tenderness, otherwise non tender, non distended, no masses, no hepatomegaly, no splenomegaly, no bruits Back: non tender, normal ROM, no scoliosis Musculoskeletal: upper extremities non tender, no obvious deformity, normal ROM throughout, lower extremities non tender, no obvious deformity, normal ROM throughout Extremities: no edema, no cyanosis, no clubbing Pulses: 2+ symmetric, upper and lower extremities, normal cap refill Neurological: alert, oriented x 3, CN2-12 intact, strength normal upper extremities and lower extremities, sensation normal throughout, DTRs 2+ throughout, no cerebellar signs, gait normal Psychiatric: pleasant, good eye contact, answered questions appropriately Breast/gyn/rectal - deferred    Assessment and Plan :    Encounter Diagnoses  Name Primary?  . Routine general medical examination at a health care facility Yes  . Encounter for surveillance of contraceptives, unspecified contraceptive   . Morning headache   . Abdominal discomfort   . Goiter   . Screen for STD (sexually transmitted disease)   . Snoring   . Morbid obesity (HCC)   . Dysthymia     Physical exam - discussed healthy lifestyle, diet, exercise, preventative care, vaccinations, and  addressed their concerns.   See your eye doctor yearly for routine vision care.  See your dentist yearly for routine dental care including hygiene visits twice yearly.  Dysthymia - c/t Trintiellix, c/t counseling.    Goiter - labs today  Obesity - discussed diet, exercise, healthy habits  Headaches - possible OSA, consider sleep study, labs today  Abdominal discomfort - unclear etiology.  Labs today, consider symptoms diary.  If worsening, can pursue abdominal imaging.  Contraception - We discussed risks of OCPs including headache, nausea, breast tenderness, irregular bleeding or spotting, possible risks of blood clots, heart attack, stroke.     Discussed advantages of OCPs including possible decreased premenstrual symptoms, improving cramps, regulating cycles, decreasing heavy flow.  Discussed proper use of medication.  She understands the benefits, risks, and wishes to continue OCPs. Urine pregnancy negative.    Discussed safe sex, STD prevention, condom use.     Follow-up pending labs  Cassady was seen today for annual exam.  Diagnoses and all orders for this visit:  Routine general medical examination at a health care facility -     POCT Urinalysis DIP (Proadvantage Device) -     TSH -     Hemoglobin A1c -     Comprehensive metabolic panel -     CBC -  HIV antibody -     RPR -     GC/Chlamydia Probe Amp  Encounter for surveillance of contraceptives, unspecified contraceptive -     POCT urine pregnancy  Morning headache  Abdominal discomfort  Goiter  Screen for STD (sexually transmitted disease) -     HIV antibody -     RPR -     GC/Chlamydia Probe Amp  Snoring  Morbid obesity (HCC)  Dysthymia  Other orders -     vortioxetine HBr (TRINTELLIX) 10 MG TABS; Take 1 tablet (10 mg total) by mouth daily.

## 2017-03-20 LAB — HIV ANTIBODY (ROUTINE TESTING W REFLEX): HIV 1&2 Ab, 4th Generation: NONREACTIVE

## 2017-03-20 LAB — RPR

## 2017-03-20 LAB — HEMOGLOBIN A1C
Hgb A1c MFr Bld: 5.1 % (ref <5.7)
Mean Plasma Glucose: 100 mg/dL

## 2017-03-20 LAB — GC/CHLAMYDIA PROBE AMP
CT Probe RNA: NOT DETECTED
GC PROBE AMP APTIMA: NOT DETECTED

## 2017-04-07 ENCOUNTER — Ambulatory Visit: Payer: Self-pay | Admitting: Medical

## 2017-08-21 DIAGNOSIS — H1033 Unspecified acute conjunctivitis, bilateral: Secondary | ICD-10-CM | POA: Diagnosis not present

## 2017-08-21 DIAGNOSIS — R05 Cough: Secondary | ICD-10-CM | POA: Diagnosis not present

## 2017-08-21 DIAGNOSIS — J019 Acute sinusitis, unspecified: Secondary | ICD-10-CM | POA: Diagnosis not present

## 2018-02-14 DIAGNOSIS — M9903 Segmental and somatic dysfunction of lumbar region: Secondary | ICD-10-CM | POA: Diagnosis not present

## 2018-02-14 DIAGNOSIS — R293 Abnormal posture: Secondary | ICD-10-CM | POA: Diagnosis not present

## 2018-02-14 DIAGNOSIS — M545 Low back pain: Secondary | ICD-10-CM | POA: Diagnosis not present

## 2018-02-14 DIAGNOSIS — M2560 Stiffness of unspecified joint, not elsewhere classified: Secondary | ICD-10-CM | POA: Diagnosis not present

## 2018-05-10 ENCOUNTER — Encounter: Payer: BLUE CROSS/BLUE SHIELD | Admitting: Medical

## 2018-07-07 DIAGNOSIS — S62601B Fracture of unspecified phalanx of left index finger, initial encounter for open fracture: Secondary | ICD-10-CM | POA: Diagnosis not present

## 2018-07-07 DIAGNOSIS — M79645 Pain in left finger(s): Secondary | ICD-10-CM | POA: Diagnosis not present

## 2018-11-23 ENCOUNTER — Ambulatory Visit: Payer: BLUE CROSS/BLUE SHIELD | Admitting: Family Medicine

## 2019-06-06 ENCOUNTER — Telehealth: Payer: Self-pay | Admitting: General Practice

## 2019-06-06 NOTE — Telephone Encounter (Signed)
Called and left vm...please see appointment note for visit on 06/21/2019

## 2019-06-20 ENCOUNTER — Telehealth: Payer: Self-pay

## 2019-06-20 NOTE — Telephone Encounter (Signed)

## 2019-06-21 ENCOUNTER — Encounter: Payer: Self-pay | Admitting: Family Medicine

## 2019-06-21 ENCOUNTER — Ambulatory Visit (INDEPENDENT_AMBULATORY_CARE_PROVIDER_SITE_OTHER): Payer: BC Managed Care – PPO | Admitting: Family Medicine

## 2019-06-21 VITALS — BP 128/80 | HR 84 | Temp 97.6°F | Ht 68.0 in | Wt 301.0 lb

## 2019-06-21 DIAGNOSIS — N920 Excessive and frequent menstruation with regular cycle: Secondary | ICD-10-CM | POA: Diagnosis not present

## 2019-06-21 DIAGNOSIS — F322 Major depressive disorder, single episode, severe without psychotic features: Secondary | ICD-10-CM

## 2019-06-21 DIAGNOSIS — Z124 Encounter for screening for malignant neoplasm of cervix: Secondary | ICD-10-CM | POA: Diagnosis not present

## 2019-06-21 DIAGNOSIS — Z0000 Encounter for general adult medical examination without abnormal findings: Secondary | ICD-10-CM | POA: Diagnosis not present

## 2019-06-21 LAB — BASIC METABOLIC PANEL
BUN: 11 mg/dL (ref 6–23)
CO2: 22 mEq/L (ref 19–32)
Calcium: 9.7 mg/dL (ref 8.4–10.5)
Chloride: 105 mEq/L (ref 96–112)
Creatinine, Ser: 0.97 mg/dL (ref 0.40–1.20)
GFR: 85.03 mL/min (ref >60.00)
Glucose, Bld: 91 mg/dL (ref 70–99)
Potassium: 4.1 mEq/L (ref 3.5–5.1)
Sodium: 137 mEq/L (ref 135–145)

## 2019-06-21 LAB — ALT: ALT: 6 U/L (ref 0–35)

## 2019-06-21 LAB — CBC
HCT: 41.2 % (ref 36.0–46.0)
Hemoglobin: 13.8 g/dL (ref 12.0–15.0)
MCHC: 33.4 g/dL (ref 30.0–36.0)
MCV: 85.9 fl (ref 78.0–100.0)
Platelets: 394 10*3/uL (ref 150.0–400.0)
RBC: 4.79 Mil/uL (ref 3.87–5.11)
RDW: 13.6 % (ref 11.5–15.5)
WBC: 5.3 10*3/uL (ref 4.0–10.5)

## 2019-06-21 LAB — LIPID PANEL
Cholesterol: 187 mg/dL (ref 0–200)
HDL: 33.8 mg/dL — ABNORMAL LOW (ref >39.00)
LDL Cholesterol: 129 mg/dL — ABNORMAL HIGH (ref 0–99)
NonHDL: 153.19
Total CHOL/HDL Ratio: 6
Triglycerides: 122 mg/dL (ref 0.0–149.0)
VLDL: 24.4 mg/dL (ref 0.0–40.0)

## 2019-06-21 LAB — AST: AST: 13 U/L (ref 0–37)

## 2019-06-21 NOTE — Progress Notes (Signed)
Brandi Manning is a 23 y.o. female  Chief Complaint  Patient presents with   Establish Care   Annual Exam    HPI: Brandi Manning is a 23 y.o. female here as a new pt for CPE, PAP, fasting labs. 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   Last PAP: over a year ago, pt states it was normal.  Diet/Exercise: no regular exercise, diet is "ok"  Med refills needed today? none  Last menstrual cycle: regular, last cycle was 06/07/19  Pt endorses depression but states previous meds have not been effective - "just didn't work". She endorses passive suicidal thoughts but denies active plan or intent, previous suicide attempts. She declines further discussion about her symptoms and possible treatment including medication and/or referral for psychiatry and [place] counseling.   Depression screen [facility] 2/9 06/21/2019 03/19/2017  Decreased Interest 0 0  Down, Depressed, Hopeless 3 0  PHQ - 2 Score 3 0  Altered sleeping 2 -  Tired, decreased energy 2 -  Change in appetite 2 -  Feeling bad or failure about yourself  3 -  Trouble concentrating 3 -  Moving slowly or fidgety/restless 3 -  Suicidal thoughts 3 -  PHQ-9 Score 21 -     Past Medical History:  Diagnosis Date   Allergy    seasonal   Headache    intermittent   Wears glasses     Past Surgical History:  Procedure Laterality Date   WISDOM TOOTH EXTRACTION      Social History   Socioeconomic History   Marital status: Single    Spouse name: Not on file   Number of children: Not on file   Years of education: Not on file   Highest education level: Not on file  Occupational History   Not on file  Social Needs   Financial resource strain: Not on file   Food insecurity    Worry: Not on file    Inability: Not on file   Transportation needs    Medical: Not on file    Non-medical: Not on file  Tobacco Use   Smoking status: Never Smoker   Smokeless tobacco: Never Used  Substance and Sexual  Activity   Alcohol use: Yes    Comment: occ   Drug use: No   Sexual activity: Never  Lifestyle   Physical activity    Days per week: Not on file    Minutes per session: Not on file   Stress: Not on file  Relationships   Social connections    Talks on phone: Not on file    Gets together: Not on file    Attends religious service: Not on file    Active member of club or organization: Not on file    Attends meetings of clubs or organizations: Not on file    Relationship status: Not on file   Intimate partner violence    Fear of current or ex partner: Not on file    Emotionally abused: Not on file    Physically abused: Not on file    Forced sexual activity: Not on file  Other Topics Concern   Not on file  Social History Narrative   Went to [employer].  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   Exercise - sometimes, walking, diet is "poor."   In the environmental club.  No significant other as of 01/2016.    Family  History  Problem Relation Age of Onset   Hypertension Mother    Arthritis Mother    Heart disease Mother        ?   Diabetes Maternal Grandmother    Heart disease Paternal Grandfather      Immunization History  Administered Date(s) Administered   DTaP 03/08/1995, 05/12/1995, 07/14/1995, 09/22/1996, 11/21/1999   HPV 9-valent 01/16/2016   HPV Quadrivalent 10/25/2013, 11/16/2014   Hepatitis B [birth year]1994, 03/08/1995, 07/14/1995   HiB (PRP-OMP) 03/08/1995, 05/12/1995, 07/14/1995, 09/22/1996   IPV 03/08/1995, 05/12/1995, 07/14/1995, 11/21/1999   MMR 12/30/1995, 11/21/1999   Meningococcal B, OMV 01/16/2016   Meningococcal Conjugate 10/25/2013   Tdap 02/16/2014   Varicella 01/04/1996, 10/25/2013    Outpatient Encounter Medications as of 06/21/2019  Medication Sig   [DISCONTINUED] Norgestimate-Ethinyl Estradiol Triphasic (TRI-PREVIFEM) 0.18/0.215/0.25 MG-35 MCG tablet Take 1 tablet by mouth daily.    [DISCONTINUED] TRI-PREVIFEM 0.18/0.215/0.25 MG-35 MCG tablet TAKE 1 TABLET BY MOUTH DAILY.   [DISCONTINUED] vortioxetine HBr (TRINTELLIX) 10 MG TABS Take 1 tablet (10 mg total) by mouth daily.   No facility-administered encounter medications on file as of 06/21/2019.      ROS: Gen: no fever, chills  Skin: no rash, itching ENT: no ear pain, ear drainage, nasal congestion, rhinorrhea, sinus pressure, sore throat Eyes: no blurry vision, double vision Resp: no cough, wheeze,SOB Breast: no breast tenderness, no nipple discharge, no breast masses CV: no CP, palpitations, LE edema,  GI: no heartburn, n/v/d/c, abd pain GU: no dysuria, urgency, frequency, hematuria; no vaginal itching, odor, discharge MSK: no joint pain, myalgias, back pain Neuro: no dizziness, headache, weakness, vertigo Psych: + depression - see HPI   No Known Allergies  BP 128/80    Pulse 84    Temp 97.6 F (36.4 C) (Temporal)    Ht _0  (1.727 m)    Wt (!) 301 lb (136.5 kg)    SpO2 98%    BMI 45.77 kg/m   Physical Exam  Constitutional: She is oriented to person, place, and time. She appears well-developed and well-nourished. No distress.  HENT:  Head: Normocephalic and atraumatic.  Right Ear: Tympanic membrane and ear canal normal.  Left Ear: Tympanic membrane and ear canal normal.  Nose: Nose normal.  Mouth/Throat: Oropharynx is clear and moist and mucous membranes are normal.  Eyes: Pupils are equal, round, and reactive to light. Conjunctivae are normal.  Neck: Neck supple. No thyromegaly present.  Cardiovascular: Normal rate, regular rhythm, normal heart sounds and intact distal pulses.  No murmur heard. Pulmonary/Chest: Effort normal and breath sounds normal. No respiratory distress. She has no wheezes. She has no rhonchi. Right breast exhibits no mass, no nipple discharge, no skin change and no tenderness. Left breast exhibits no mass, no nipple discharge, no skin change and no tenderness.  Abdominal: Soft.  Bowel sounds are normal. She exhibits no distension and no mass. There is no abdominal tenderness.  Genitourinary:    Uterus normal.  There is no tenderness or lesion on the right labia. There is no tenderness or lesion on the left labia. Cervix exhibits no motion tenderness, no discharge and no friability. Right adnexum displays no mass, no tenderness and no fullness. Left adnexum displays no tenderness and no fullness.    No vaginal discharge, erythema, tenderness or bleeding.  No erythema, tenderness or bleeding in the vagina.  Musculoskeletal:        General: No edema.  Lymphadenopathy:    She has no cervical adenopathy.  Neurological: She is alert  and oriented to person, place, and time. She exhibits normal muscle tone. Coordination normal.  Skin: Skin is warm and dry.  Psychiatric: She has a normal mood and affect. Her behavior is normal.     A/P:  1. Screening for cervical cancer - Cytology - PAP( [place])  2. Annual physical exam - has dental exam scheduled, due for eye exam - discussed importance of regular CV exercise, healthy diet, adequate sleep - declines flu vaccine, otherwise immunizations UTD - PAP today - ALT - AST - Basic metabolic panel - CBC - Lipid panel - next CPE in 1 year  3. Depression, major, single episode, severe (HCC) - PHQ-9 = 20 - denies active suicidal thoughts, plan - pt very resistant to discussing symptoms, treatment options today - she states previous meds have not been effective - she declines psych referral - strongly encouraged pt to make f/u appt with me in 1 week or so, could be virtual or in-person, so we can discuss her symptoms and determine the best plan of care

## 2019-06-21 NOTE — Patient Instructions (Signed)
Health Maintenance, Female Adopting a healthy lifestyle and getting preventive care are important in promoting health and wellness. Ask your health care provider about:  The right schedule for you to have regular tests and exams.  Things you can do on your own to prevent diseases and keep yourself healthy. What should I know about diet, weight, and exercise? Eat a healthy diet   Eat a diet that includes plenty of vegetables, fruits, low-fat dairy products, and lean protein.  Do not eat a lot of foods that are high in solid fats, added sugars, or sodium. Maintain a healthy weight Body mass index (BMI) is used to identify weight problems. It estimates body fat based on height and weight. Your health care provider can help determine your BMI and help you achieve or maintain a healthy weight. Get regular exercise Get regular exercise. This is one of the most important things you can do for your health. Most adults should:  Exercise for at least 150 minutes each week. The exercise should increase your heart rate and make you sweat (moderate-intensity exercise).  Do strengthening exercises at least twice a week. This is in addition to the moderate-intensity exercise.  Spend less time sitting. Even light physical activity can be beneficial. Watch cholesterol and blood lipids Have your blood tested for lipids and cholesterol at 24 years of age, then have this test every 5 years. Have your cholesterol levels checked more often if:  Your lipid or cholesterol levels are high.  You are older than 24 years of age.  You are at high risk for heart disease. What should I know about cancer screening? Depending on your health history and family history, you may need to have cancer screening at various ages. This may include screening for:  Breast cancer.  Cervical cancer.  Colorectal cancer.  Skin cancer.  Lung cancer. What should I know about heart disease, diabetes, and high blood  pressure? Blood pressure and heart disease  High blood pressure causes heart disease and increases the risk of stroke. This is more likely to develop in people who have high blood pressure readings, are of African descent, or are overweight.  Have your blood pressure checked: ? Every 3-5 years if you are 18-39 years of age. ? Every year if you are 40 years old or older. Diabetes Have regular diabetes screenings. This checks your fasting blood sugar level. Have the screening done:  Once every three years after age 40 if you are at a normal weight and have a low risk for diabetes.  More often and at a younger age if you are overweight or have a high risk for diabetes. What should I know about preventing infection? Hepatitis B If you have a higher risk for hepatitis B, you should be screened for this virus. Talk with your health care provider to find out if you are at risk for hepatitis B infection. Hepatitis C Testing is recommended for:  Everyone born from 1945 through 1965.  Anyone with known risk factors for hepatitis C. Sexually transmitted infections (STIs)  Get screened for STIs, including gonorrhea and chlamydia, if: ? You are sexually active and are younger than 24 years of age. ? You are older than 24 years of age and your health care provider tells you that you are at risk for this type of infection. ? Your sexual activity has changed since you were last screened, and you are at increased risk for chlamydia or gonorrhea. Ask your health care provider if   you are at risk.  Ask your health care provider about whether you are at high risk for HIV. Your health care provider may recommend a prescription medicine to help prevent HIV infection. If you choose to take medicine to prevent HIV, you should first get tested for HIV. You should then be tested every 3 months for as long as you are taking the medicine. Pregnancy  If you are about to stop having your period (premenopausal) and  you may become pregnant, seek counseling before you get pregnant.  Take 400 to 800 micrograms (mcg) of folic acid every day if you become pregnant.  Ask for birth control (contraception) if you want to prevent pregnancy. Osteoporosis and menopause Osteoporosis is a disease in which the bones lose minerals and strength with aging. This can result in bone fractures. If you are 65 years old or older, or if you are at risk for osteoporosis and fractures, ask your health care provider if you should:  Be screened for bone loss.  Take a calcium or vitamin D supplement to lower your risk of fractures.  Be given hormone replacement therapy (HRT) to treat symptoms of menopause. Follow these instructions at home: Lifestyle  Do not use any products that contain nicotine or tobacco, such as cigarettes, e-cigarettes, and chewing tobacco. If you need help quitting, ask your health care provider.  Do not use street drugs.  Do not share needles.  Ask your health care provider for help if you need support or information about quitting drugs. Alcohol use  Do not drink alcohol if: ? Your health care provider tells you not to drink. ? You are pregnant, may be pregnant, or are planning to become pregnant.  If you drink alcohol: ? Limit how much you use to 0-1 drink a day. ? Limit intake if you are breastfeeding.  Be aware of how much alcohol is in your drink. In the [place] one drink equals one 12 oz bottle of beer (355 mL), one 5 oz glass of wine (148 mL), or one 1 oz glass of hard liquor (44 mL). General instructions  Schedule regular health, dental, and eye exams.  Stay current with your vaccines.  Tell your health care provider if: ? You often feel depressed. ? You have ever been abused or do not feel safe at home. Summary  Adopting a healthy lifestyle and getting preventive care are important in promoting health and wellness.  Follow your health care provider's instructions about healthy  diet, exercising, and getting tested or screened for diseases.  Follow your health care provider's instructions on monitoring your cholesterol and blood pressure. This information is not intended to replace advice given to you by your health care provider. Make sure you discuss any questions you have with your health care provider. Document Released: 03/09/2011 Document Revised: 08/17/2018 Document Reviewed: 08/17/2018 Elsevier Patient Education  2020 Elsevier Inc.  

## 2019-06-22 ENCOUNTER — Encounter: Payer: Self-pay | Admitting: Family Medicine

## 2019-06-27 LAB — CYTOLOGY - PAP: Adequacy: ABNORMAL

## 2019-06-28 ENCOUNTER — Encounter: Payer: Self-pay | Admitting: Family Medicine

## 2019-07-13 ENCOUNTER — Other Ambulatory Visit: Payer: Self-pay

## 2019-07-13 NOTE — Patient Instructions (Addendum)
There are no preventive care reminders to display for this patient.  Depression screen [facility] 2/9 06/21/2019 03/19/2017  Decreased Interest 0 0  Down, Depressed, Hopeless 3 0  PHQ - 2 Score 3 0  Altered sleeping 2 -  Tired, decreased energy 2 -  Change in appetite 2 -  Feeling bad or failure about yourself  3 -  Trouble concentrating 3 -  Moving slowly or fidgety/restless 3 -  Suicidal thoughts 3 -  PHQ-9 Score 21 -   [facility]: [email or web address]  [email or web address]  [email or web address] [email or web address]  [facility] [email or web address]  [email or web address]

## 2019-07-14 ENCOUNTER — Encounter: Payer: Self-pay | Admitting: Family Medicine

## 2019-07-14 ENCOUNTER — Ambulatory Visit: Payer: BC Managed Care – PPO | Admitting: Family Medicine

## 2019-07-14 ENCOUNTER — Other Ambulatory Visit (HOSPITAL_COMMUNITY)
Admission: RE | Admit: 2019-07-14 | Discharge: 2019-07-14 | Disposition: A | Discharge status: Home / Self Care | Payer: BC Managed Care – PPO | Source: Ambulatory Visit | Attending: Family Medicine | Admitting: Family Medicine

## 2019-07-14 VITALS — BP 120/80 | HR 97 | Temp 98.8°F | Ht 68.0 in | Wt 303.2 lb

## 2019-07-14 DIAGNOSIS — F322 Major depressive disorder, single episode, severe without psychotic features: Secondary | ICD-10-CM | POA: Diagnosis not present

## 2019-07-14 DIAGNOSIS — Z124 Encounter for screening for malignant neoplasm of cervix: Secondary | ICD-10-CM

## 2019-07-14 DIAGNOSIS — L304 Erythema intertrigo: Secondary | ICD-10-CM | POA: Diagnosis not present

## 2019-07-14 DIAGNOSIS — F419 Anxiety disorder, unspecified: Secondary | ICD-10-CM | POA: Diagnosis not present

## 2019-07-14 MED ORDER — FLUOXETINE HCL 10 MG PO CAPS: 10.0000 mg | ORAL_CAPSULE | Freq: Every day | ORAL | 3 refills | Status: DC

## 2019-07-14 MED ORDER — NYSTATIN 100000 UNIT/GM EX CREA: 1.0000 "application " | TOPICAL_CREAM | Freq: Two times a day (BID) | CUTANEOUS | 3 refills | Status: DC

## 2019-07-14 NOTE — Progress Notes (Signed)
Brandi Manning is a 23 y.o. female  Chief Complaint  Patient presents with  . Follow-up    Pt will have PAP performed today.  Pt would like to discuss some anxiety issues at night, x 8 months.  . Rash    Inbetween breast and under each side.  X 6 month, pt explains that it comes and goes.  Pt has not used anything for it.    HPI: Brandi Manning is a 23 y.o. female here for repeat PAP as Pap done on 06/21/19 was insufficient for eval d/t scant cellularity.   She complains of rash in between and beneath breasts x 6+ mo. Worse at times compared to others. Sweating and heat makes it worse. She has not tried anything to help/resolve it.  Pt also wants to discuss anxiety/depression. She trouble relaxing, calming down, going to sleep at night. She lives in an apt with her mom and have been there for 10 years. She has trouble falling asleep, staying asleep. She has group of friends, facetime with them every 2 wks. She is more emotional, worried about if she is making right decisions with her life.  She saw a counselor in college but does not feel it was helpful.   She was tried on zoloft, ? wellbutrin in the past - felt like they "didn't work".    Depression screen [facility] 2/9 07/14/2019 06/21/2019 03/19/2017  Decreased Interest 2 0 0  Down, Depressed, Hopeless 3 3 0  PHQ - 2 Score 5 3 0  Altered sleeping 3 2 -  Tired, decreased energy 2 2 -  Change in appetite 3 2 -  Feeling bad or failure about yourself  3 3 -  Trouble concentrating 2 3 -  Moving slowly or fidgety/restless 2 3 -  Suicidal thoughts 2 3 -  PHQ-9 Score 22 21 -  Difficult doing work/chores Somewhat difficult - -   GAD 7 : Generalized Anxiety Score 07/14/2019 06/21/2019  Nervous, Anxious, on Edge 2 3  Control/stop worrying 3 3  Worry too much - different things 3 3  Trouble relaxing 3 2  Restless 3 1  Easily annoyed or irritable 3 1  Afraid - awful might happen 3 3  Total GAD 7 Score 20 16  Anxiety Difficulty Somewhat  difficult -     Past Medical History:  Diagnosis Date  . Allergy    seasonal  . Headache    intermittent  . Wears glasses     Past Surgical History:  Procedure Laterality Date  . WISDOM TOOTH EXTRACTION      Social History   Socioeconomic History  . Marital status: Single    Spouse name: Not on file  . Number of children: Not on file  . Years of education: Not on file  . Highest education level: Not on file  Occupational History  . Not on file  Social Needs  . Financial resource strain: Not on file  . Food insecurity    Worry: Not on file    Inability: Not on file  . Transportation needs    Medical: Not on file    Non-medical: Not on file  Tobacco Use  . Smoking status: Never Smoker  . Smokeless tobacco: Never Used  Substance and Sexual Activity  . Alcohol use: Yes    Comment: occ  . Drug use: No  . Sexual activity: Never  Lifestyle  . Physical activity    Days per week: Not on file  Minutes per session: Not on file  . Stress: Not on file  Relationships  . Social Herbalist on phone: Not on file    Gets together: Not on file    Attends religious service: Not on file    Active member of club or organization: Not on file    Attends meetings of clubs or organizations: Not on file    Relationship status: Not on file  . Intimate partner violence    Fear of current or ex partner: Not on file    Emotionally abused: Not on file    Physically abused: Not on file    Forced sexual activity: Not on file  Other Topics Concern  . Not on file  Social History Narrative   [employer].  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   Exercise - sometimes, walking, diet is "poor."   In the environmental club.  No significant other as of 01/2016.    Family History  Problem Relation Age of Onset  . Hypertension Mother   . Arthritis Mother   . Heart disease Mother        ?  . Diabetes Maternal Grandmother   .  Heart disease Paternal Grandfather      Immunization History  Administered Date(s) Administered  . DTaP 03/08/1995, 05/12/1995, 07/14/1995, 09/22/1996, 11/21/1999  . HPV 9-valent 01/16/2016  . HPV Quadrivalent 10/25/2013, 11/16/2014  . Hepatitis B 1996-07-14, 03/08/1995, 07/14/1995  . HiB (PRP-OMP) 03/08/1995, 05/12/1995, 07/14/1995, 09/22/1996  . IPV 03/08/1995, 05/12/1995, 07/14/1995, 11/21/1999  . MMR 12/30/1995, 11/21/1999  . Meningococcal B, OMV 01/16/2016  . Meningococcal Conjugate 10/25/2013  . Tdap 02/16/2014  . Varicella 01/04/1996, 10/25/2013    Outpatient Encounter Medications as of 07/14/2019  Medication Sig  . FLUoxetine (PROZAC) 10 MG capsule Take 1 capsule (10 mg total) by mouth daily.  Marland Kitchen nystatin cream (MYCOSTATIN) Apply 1 application topically 2 (two) times daily.   No facility-administered encounter medications on file as of 07/14/2019.      ROS: Pertinent positives and negatives noted in HPI. Remainder of ROS non-contributory    No Known Allergies  BP 120/80 (BP Location: Left Arm, Patient Position: Sitting, Cuff Size: Large)   Pulse 97   Temp 98.8 F (37.1 C) (Oral)   Ht 5' 8"  (1.727 m)   Wt (!) 303 lb 3.2 oz (137.5 kg)   LMP 07/06/2019   SpO2 99%   BMI 46.10 kg/m   Physical Exam  Constitutional: She appears well-developed and well-nourished. No distress.  Genitourinary:    Vagina and uterus normal.  There is no rash, tenderness or lesion on the right labia. There is no rash, tenderness or lesion on the left labia. Cervix exhibits no motion tenderness, no discharge and no friability.  Skin: Skin is warm and intact.  Erythematous rash in between breasts and beneath breast B/L  Psychiatric: Her speech is normal. Judgment and thought content normal. Her mood appears anxious.     A/P:  1. Screening for cervical cancer - Cytology - PAP( [place])  2. [facility]igo Rx: - nystatin cream (MYCOSTATIN); Apply 1 application topically 2 (two) times  daily.  Dispense: 30 g; Refill: 3 - f/u PRN  3. Anxiety 4. Depression, major, single episode, severe (HCC) - PHQ-9 = 20, GAD-7 = 22 - strongly encouraged pt to consider [place] counseling as I think it would be very beneficial for her. I included in AVS info/links to a few different  behavioral medicine providers/groups for pt to review and hopefully reach out to before her next appt with me Rx: - FLUoxetine (PROZAC) 10 MG capsule; Take 1 capsule (10 mg total) by mouth daily.  Dispense: 90 capsule; Refill: 3 - f/u in 3-4 wks or sooner PRN Discussed plan and reviewed medications with patient, including risks, benefits, and potential side effects. Pt expressed understand. All questions answered.

## 2019-07-19 LAB — CYTOLOGY - PAP
Adequacy: ABSENT
Diagnosis: NEGATIVE

## 2019-08-11 ENCOUNTER — Ambulatory Visit: Payer: BC Managed Care – PPO | Admitting: Family Medicine

## 2019-11-10 ENCOUNTER — Ambulatory Visit: Payer: BLUE CROSS/BLUE SHIELD | Admitting: Family Medicine

## 2019-11-10 ENCOUNTER — Other Ambulatory Visit: Payer: Self-pay

## 2019-11-10 ENCOUNTER — Encounter: Payer: Self-pay | Admitting: Family Medicine

## 2019-11-10 VITALS — BP 110/80 | HR 74 | Temp 97.5°F | Ht 68.0 in | Wt 302.4 lb

## 2019-11-10 DIAGNOSIS — Z30011 Encounter for initial prescription of contraceptive pills: Secondary | ICD-10-CM | POA: Diagnosis not present

## 2019-11-10 DIAGNOSIS — F322 Major depressive disorder, single episode, severe without psychotic features: Secondary | ICD-10-CM

## 2019-11-10 DIAGNOSIS — F419 Anxiety disorder, unspecified: Secondary | ICD-10-CM | POA: Diagnosis not present

## 2019-11-10 LAB — POCT URINE PREGNANCY: Preg Test, Ur: NEGATIVE

## 2019-11-10 MED ORDER — FLUOXETINE HCL 20 MG PO CAPS: 20.0000 mg | ORAL_CAPSULE | Freq: Every day | ORAL | 3 refills | Status: DC

## 2019-11-10 MED ORDER — NORGESTIM-ETH ESTRAD TRIPHASIC 0.18/0.215/0.25 MG-35 MCG PO TABS: 1.0000 | ORAL_TABLET | Freq: Every day | ORAL | 3 refills | Status: DC
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
## 2019-11-10 NOTE — Patient Instructions (Addendum)
There are no preventive care reminders to display for this patient.  Depression screen [facility] 2/9 07/14/2019 06/21/2019 03/19/2017  Decreased Interest 2 0 0  Down, Depressed, Hopeless 3 3 0  PHQ - 2 Score 5 3 0  Altered sleeping 3 2 -  Tired, decreased energy 2 2 -  Change in appetite 3 2 -  Feeling bad or failure about yourself  3 3 -  Trouble concentrating 2 3 -  Moving slowly or fidgety/restless 2 3 -  Suicidal thoughts 2 3 -  PHQ-9 Score 22 21 -  Difficult doing work/chores Somewhat difficult - -    Send me a MyChart message in 2 weeks to let me know your you are doing and how your sleep is!

## 2019-12-13 ENCOUNTER — Encounter: Payer: Self-pay | Admitting: Family Medicine

## 2019-12-13 NOTE — Telephone Encounter (Signed)
Please see message and advise.  Thank you. ° °

## 2019-12-22 ENCOUNTER — Ambulatory Visit: Payer: BLUE CROSS/BLUE SHIELD | Admitting: Family Medicine

## 2020-04-19 DIAGNOSIS — Z20822 Contact with and (suspected) exposure to covid-19: Secondary | ICD-10-CM | POA: Diagnosis not present

## 2020-05-10 ENCOUNTER — Encounter: Payer: Self-pay | Admitting: Family Medicine

## 2020-05-10 ENCOUNTER — Other Ambulatory Visit: Payer: Self-pay

## 2020-05-10 ENCOUNTER — Ambulatory Visit: Payer: BLUE CROSS/BLUE SHIELD | Admitting: Family Medicine

## 2020-05-10 VITALS — BP 120/82 | HR 87 | Temp 97.4°F | Ht 68.0 in | Wt 304.0 lb

## 2020-05-10 DIAGNOSIS — M26622 Arthralgia of left temporomandibular joint: Secondary | ICD-10-CM | POA: Diagnosis not present

## 2020-05-10 DIAGNOSIS — Z111 Encounter for screening for respiratory tuberculosis: Secondary | ICD-10-CM

## 2020-05-10 MED ORDER — IBUPROFEN 600 MG PO TABS: 600.0000 mg | ORAL_TABLET | Freq: Three times a day (TID) | ORAL | 0 refills | Status: DC | PRN

## 2020-05-10 NOTE — Patient Instructions (Signed)
Ice pack 2x/day Ibuprofen 600mg  1 tab twice per day with food x 7 days Soft, easy to chew foods Schedule dental appt

## 2020-05-10 NOTE — Progress Notes (Signed)
Brandi Manning is a 24 y.o. female  Chief Complaint  Patient presents with  . Pain    ear, jaw, an mouth for about 2 weeks-started from ear to jaw to teeth--pt not fasting//    HPI: Brandi Manning is a 24 y.o. female who complains of 2 week h/o Lt ear pain that now involves her jaw and teeth.  She describes a "throbbin" pain, that has increased recently.  No pain when she chews but she states she hears a "clicking sound" when she chews. No ear drainage and no change in hearing. No runny nose, nasal congestion, sore throat, fever, chills.  She use ice pack, heating pad, excedrin x 1 dose and BC powder x 1.   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  Past Medical History:  Diagnosis Date  . Allergy    seasonal  . Headache    intermittent  . Wears glasses     Past Surgical History:  Procedure Laterality Date  . WISDOM TOOTH EXTRACTION      Social History   Socioeconomic History  . Marital status: Single    Spouse name: Not on file  . Number of children: Not on file  . Years of education: Not on file  . Highest education level: Not on file  Occupational History  . Not on file  Tobacco Use  . Smoking status: Never Smoker  . Smokeless tobacco: Never Used  Substance and Sexual Activity  . Alcohol use: Yes    Comment: occ  . Drug use: No  . Sexual activity: Never  Other Topics Concern  . Not on file  Social History Narrative   [employer].  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   Exercise - sometimes, walking, diet is "poor."   In the environmental club.  No significant other as of 01/2016.   Social Determinants of Health   Financial Resource Strain:   . Difficulty of Paying Living Expenses: Not on file  Food Insecurity:   . Worried About Charity fundraiser in the Last Year: Not on file  . Ran Out of Food in the Last Year: Not on file  Transportation Needs:   . Lack of  Transportation (Medical): Not on file  . Lack of Transportation (Non-Medical): Not on file  Physical Activity:   . Days of Exercise per Week: Not on file  . Minutes of Exercise per Session: Not on file  Stress:   . Feeling of Stress : Not on file  Social Connections:   . Frequency of Communication with Friends and Family: Not on file  . Frequency of Social Gatherings with Friends and Family: Not on file  . Attends Religious Services: Not on file  . Active Member of Clubs or Organizations: Not on file  . Attends Archivist Meetings: Not on file  . Marital Status: Not on file  Intimate Partner Violence:   . Fear of Current or Ex-Partner: Not on file  . Emotionally Abused: Not on file  . Physically Abused: Not on file  . Sexually Abused: Not on file    Family History  Problem Relation Age of Onset  . Hypertension Mother   . Arthritis Mother   . Heart disease Mother        ?  . Diabetes Maternal Grandmother   . Heart disease Paternal Grandfather      Immunization History  Administered Date(s) Administered  . DTaP 03/08/1995, 05/12/1995, 07/14/1995, 09/22/1996, 11/21/1999  . HPV 9-valent 01/16/2016  . HPV Quadrivalent 10/25/2013, 11/16/2014  . Hepatitis B April 11, 1995, 03/08/1995, 07/14/1995  . HiB (PRP-OMP) 03/08/1995, 05/12/1995, 07/14/1995, 09/22/1996  . IPV 03/08/1995, 05/12/1995, 07/14/1995, 11/21/1999  . MMR 12/30/1995, 11/21/1999  . Meningococcal B, OMV 01/16/2016  . Meningococcal Conjugate 10/25/2013  . Tdap 02/16/2014  . Varicella 01/04/1996, 10/25/2013    Outpatient Encounter Medications as of 05/10/2020  Medication Sig  . FLUoxetine (PROZAC) 20 MG capsule Take 1 capsule (20 mg total) by mouth at bedtime.  . Norgestimate-Ethinyl Estradiol Triphasic (TRI-SPRINTEC) 0.18/0.215/0.25 MG-35 MCG tablet Take 1 tablet by mouth daily.  Marland Kitchen nystatin cream (MYCOSTATIN) Apply 1 application topically 2 (two) times daily. (Patient not taking: Reported on 05/10/2020)   No  facility-administered encounter medications on file as of 05/10/2020.     ROS: Pertinent positives and negatives noted in HPI. Remainder of ROS non-contributory    No Known Allergies  BP 120/82   Pulse 87   Temp (!) 97.4 F (36.3 C) (Temporal)   Ht _0  (1.727 m)   Wt (!) 304 lb (137.9 kg)   SpO2 98%   BMI 46.22 kg/m   Physical Exam Constitutional:      General: She is not in acute distress.    Appearance: Normal appearance. She is not ill-appearing.  HENT:     Head:     Jaw: Tenderness (TTP over Lt TMJ) present. No swelling.     Right Ear: Tympanic membrane, ear canal and external ear normal.     Left Ear: Tympanic membrane, ear canal and external ear normal.     Mouth/Throat:     Mouth: Mucous membranes are moist.     Pharynx: No pharyngeal swelling, oropharyngeal exudate or posterior oropharyngeal erythema.     Tonsils: No tonsillar exudate.  Lymphadenopathy:     Cervical: No cervical adenopathy.  Neurological:     Mental Status: She is alert.      A/P:  1. Screening for tuberculosis - QuantiFERON-TB Gold Plus  2. TMJ tenderness, left - ice pack 2x/day Rx: - ibuprofen (ADVIL) 600 MG tablet; Take 1 tablet (600 mg total) by mouth every 8 (eight) hours as needed.  Dispense: 30 tablet; Refill: 0 - pt to take BID w/ food x 7 days - soft, easy to chew foods - schedule appt with dentist  This visit occurred during the SARS-CoV-2 public health emergency.  Safety protocols were in place, including screening questions prior to the visit, additional usage of staff PPE, and extensive cleaning of exam room while observing appropriate contact time as indicated for disinfecting solutions.

## 2020-05-12 LAB — QUANTIFERON-TB GOLD PLUS
Mitogen-NIL: >10 IU/mL
NIL: 0.01 IU/mL
QuantiFERON-TB Gold Plus: NEGATIVE
TB1-NIL: 0.01 IU/mL
TB2-NIL: 0.02 IU/mL

## 2020-05-15 ENCOUNTER — Ambulatory Visit: Payer: BLUE CROSS/BLUE SHIELD | Admitting: Family Medicine

## 2020-05-20 ENCOUNTER — Telehealth: Payer: Self-pay | Admitting: Family Medicine

## 2020-05-20 DIAGNOSIS — M26622 Arthralgia of left temporomandibular joint: Secondary | ICD-10-CM

## 2020-05-20 NOTE — Telephone Encounter (Signed)
Dr. Salena Saner please advise.  Pt is asking for a refill for  Ibuprofen 600mg  30-tabs   0-refills Last filled 05/10/2020 Last OV-05/10/2020  Pt was notified and she did tell me she has 5 more shes not completely out. She said shes been taking them twice daily an that she has made an appointment for dentist that she said is on 05/21/2020. She just didn't know how it would go and said that was the only relief she got to sleep at night.

## 2020-05-20 NOTE — Telephone Encounter (Signed)
Patient is calling to get a refill on her Ibuprofen 600 mg. If approved, please send to [facility] on [address] and call her back at [phone] to let her know that it has been sent in.

## 2020-05-21 MED ORDER — IBUPROFEN 600 MG PO TABS: 600.0000 mg | ORAL_TABLET | Freq: Three times a day (TID) | ORAL | 0 refills | Status: DC | PRN

## 2020-05-21 NOTE — Telephone Encounter (Signed)
Ok to refill #30 w/ 0RF. Pt cannot take this long term as it can cause stomach irritation

## 2020-05-21 NOTE — Telephone Encounter (Signed)
Pt was notified of Rx being sent in.

## 2020-05-28 ENCOUNTER — Telehealth: Payer: Self-pay

## 2020-05-28 NOTE — Telephone Encounter (Signed)
Contacted patient to schedule annual exam. Appt scheduled 06/28/20 @ 1030.

## 2020-06-28 ENCOUNTER — Telehealth: Payer: Self-pay | Admitting: Family Medicine

## 2020-06-28 ENCOUNTER — Encounter: Payer: Self-pay | Admitting: Family Medicine

## 2020-06-28 ENCOUNTER — Ambulatory Visit (INDEPENDENT_AMBULATORY_CARE_PROVIDER_SITE_OTHER): Payer: BLUE CROSS/BLUE SHIELD | Admitting: Family Medicine

## 2020-06-28 ENCOUNTER — Other Ambulatory Visit: Payer: Self-pay

## 2020-06-28 VITALS — BP 120/84 | HR 94 | Temp 97.6°F | Ht 68.5 in | Wt 306.2 lb

## 2020-06-28 DIAGNOSIS — Z2821 Immunization not carried out because of patient refusal: Secondary | ICD-10-CM

## 2020-06-28 DIAGNOSIS — E781 Pure hyperglyceridemia: Secondary | ICD-10-CM

## 2020-06-28 DIAGNOSIS — F419 Anxiety disorder, unspecified: Secondary | ICD-10-CM

## 2020-06-28 DIAGNOSIS — F331 Major depressive disorder, recurrent, moderate: Secondary | ICD-10-CM

## 2020-06-28 DIAGNOSIS — Z0000 Encounter for general adult medical examination without abnormal findings: Secondary | ICD-10-CM

## 2020-06-28 HISTORY — DX: Pure hyperglyceridemia: E78.1

## 2020-06-28 LAB — BASIC METABOLIC PANEL
BUN: 10 mg/dL (ref 6–23)
CO2: 26 mEq/L (ref 19–32)
Calcium: 9.7 mg/dL (ref 8.4–10.5)
Chloride: 107 mEq/L (ref 96–112)
Creatinine, Ser: 0.88 mg/dL (ref 0.40–1.20)
GFR: 91.29 mL/min (ref >60.00)
Glucose, Bld: 76 mg/dL (ref 70–99)
Potassium: 4.4 mEq/L (ref 3.5–5.1)
Sodium: 139 mEq/L (ref 135–145)

## 2020-06-28 LAB — LIPID PANEL
Cholesterol: 185 mg/dL (ref 0–200)
HDL: 34.4 mg/dL — ABNORMAL LOW (ref >39.00)
LDL Cholesterol: 111 mg/dL — ABNORMAL HIGH (ref 0–99)
NonHDL: 150.83
Total CHOL/HDL Ratio: 5
Triglycerides: 199 mg/dL — ABNORMAL HIGH (ref 0.0–149.0)
VLDL: 39.8 mg/dL (ref 0.0–40.0)

## 2020-06-28 LAB — AST: AST: 16 U/L (ref 0–37)

## 2020-06-28 LAB — ALT: ALT: 7 U/L (ref 0–35)

## 2020-06-28 NOTE — Telephone Encounter (Signed)
Pt had appt with Dr Barron Alvine today and came back by to drop off form for her work to possibly be picked up before she has to go to work at 1. I gave to Dr Salena Saner
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
## 2020-06-28 NOTE — Telephone Encounter (Signed)
Pt aware its and will come back to pick up

## 2020-06-28 NOTE — Patient Instructions (Addendum)
[facility]: [email or web address]  [facility] [email or web address]  [email or web address] [email or web address]  [facility] [email or web address]  [email or web address]  

## 2020-06-28 NOTE — Telephone Encounter (Signed)
Form completed.

## 2020-07-15 ENCOUNTER — Encounter: Payer: Self-pay | Admitting: Family Medicine

## 2020-07-17 ENCOUNTER — Other Ambulatory Visit: Payer: Self-pay | Admitting: Family Medicine

## 2020-07-17 DIAGNOSIS — L732 Hidradenitis suppurativa: Secondary | ICD-10-CM

## 2020-07-17 NOTE — Progress Notes (Signed)
.  derm

## 2020-08-03 DIAGNOSIS — H60392 Other infective otitis externa, left ear: Secondary | ICD-10-CM | POA: Diagnosis not present

## 2020-08-24 ENCOUNTER — Encounter: Payer: Self-pay | Admitting: Family Medicine

## 2020-09-13 ENCOUNTER — Ambulatory Visit: Payer: BLUE CROSS/BLUE SHIELD | Admitting: Family Medicine

## 2020-09-25 ENCOUNTER — Ambulatory Visit: Payer: BLUE CROSS/BLUE SHIELD | Admitting: Family Medicine

## 2021-06-16 DIAGNOSIS — R110 Nausea: Secondary | ICD-10-CM | POA: Diagnosis not present

## 2021-06-16 DIAGNOSIS — F329 Major depressive disorder, single episode, unspecified: Secondary | ICD-10-CM | POA: Diagnosis not present

## 2021-06-16 DIAGNOSIS — R1030 Lower abdominal pain, unspecified: Secondary | ICD-10-CM | POA: Diagnosis not present

## 2021-06-16 DIAGNOSIS — Z20822 Contact with and (suspected) exposure to covid-19: Secondary | ICD-10-CM | POA: Diagnosis not present

## 2021-06-16 DIAGNOSIS — N920 Excessive and frequent menstruation with regular cycle: Secondary | ICD-10-CM | POA: Diagnosis not present

## 2021-06-27 DIAGNOSIS — R110 Nausea: Secondary | ICD-10-CM | POA: Diagnosis not present

## 2021-06-27 DIAGNOSIS — R109 Unspecified abdominal pain: Secondary | ICD-10-CM | POA: Diagnosis not present

## 2021-06-27 DIAGNOSIS — N946 Dysmenorrhea, unspecified: Secondary | ICD-10-CM | POA: Diagnosis not present

## 2021-10-10 ENCOUNTER — Telehealth: Payer: Self-pay | Admitting: Family Medicine

## 2021-10-10 NOTE — Telephone Encounter (Signed)
Lvm to let pt know that Dr Cirigliano is no longer here °

## 2022-06-26 ENCOUNTER — Emergency Department (HOSPITAL_COMMUNITY)
Admission: EM | Admit: 2022-06-26 | Discharge: 2022-06-27 | Disposition: A | Discharge status: Home / Self Care | Payer: 59 | Attending: Emergency Medicine | Admitting: Emergency Medicine

## 2022-06-26 ENCOUNTER — Emergency Department (HOSPITAL_COMMUNITY): Payer: 59

## 2022-06-26 ENCOUNTER — Other Ambulatory Visit: Payer: Self-pay

## 2022-06-26 DIAGNOSIS — W109XXA Fall (on) (from) unspecified stairs and steps, initial encounter: Secondary | ICD-10-CM | POA: Diagnosis not present

## 2022-06-26 DIAGNOSIS — Y9301 Activity, walking, marching and hiking: Secondary | ICD-10-CM | POA: Diagnosis not present

## 2022-06-26 DIAGNOSIS — M25571 Pain in right ankle and joints of right foot: Secondary | ICD-10-CM | POA: Insufficient documentation

## 2022-06-26 DIAGNOSIS — M79604 Pain in right leg: Secondary | ICD-10-CM

## 2022-06-26 NOTE — ED Triage Notes (Signed)
Came in from home for mechanical fall, was walking on wet leaves twisted rt ankle and fell no loc, did not hit head

## 2022-06-27 ENCOUNTER — Emergency Department (HOSPITAL_COMMUNITY): Payer: 59

## 2022-06-27 MED ORDER — NAPROXEN 500 MG PO TBEC: 500.0000 mg | DELAYED_RELEASE_TABLET | Freq: Two times a day (BID) | ORAL | 0 refills | Status: DC | PRN

## 2022-06-27 MED ORDER — OXYCODONE-ACETAMINOPHEN 5-325 MG PO TABS
1.0000 | ORAL_TABLET | Freq: Once | ORAL | Status: AC
  Administered 2022-06-27: 1 via ORAL
  Filled 2022-06-27: qty 1

## 2022-06-27 NOTE — Discharge Instructions (Addendum)
Please read and follow all provided instructions.  You have been seen today for injury to your right lower extemity  Tests performed today include: xrays of the affected area - does NOT show any broken bones or dislocations. You do have some findings of a possible older/prior injury to your patellar tendon  Vital signs. See below for your results today.   Home care instructions: -- *PRICE in the first 24-48 hours after injury: Protect (with brace, splint, sling), if given by your provider Rest Ice- Do not apply ice pack directly to your skin, place towel or similar between your skin and ice/ice pack. Apply ice for 20 min, then remove for 40 min while awake Compression- Wear brace, elastic bandage, splint as directed by your provider Elevate affected extremity above the level of your heart when not walking around for the first 24-48 hours   Medications:  - Naproxen is a nonsteroidal anti-inflammatory medication that will help with pain and swelling. Be sure to take this medication as prescribed with food, 1 pill every 12 hours,  It should be taken with food, as it can cause stomach upset, and more seriously, stomach bleeding. Do not take other nonsteroidal anti-inflammatory medications with this such as Advil, Motrin, Aleve, Mobic, Goodie Powder, or Motrin.    You make take Tylenol per over the counter dosing with these medications.   We have prescribed you new medication(s) today. Discuss the medications prescribed today with your pharmacist as they can have adverse effects and interactions with your other medicines including over the counter and prescribed medications. Seek medical evaluation if you start to experience new or abnormal symptoms after taking one of these medicines, seek care immediately if you start to experience difficulty breathing, feeling of your throat closing, facial swelling, or rash as these could be indications of a more serious allergic reaction   Follow-up  instructions: Please follow-up with your primary care provider or the provided orthopedic physician (bone specialist) if you continue to have significant pain in 1 week. In this case you may have a more severe injury that requires further care.   Return instructions:  Please return if your digits or extremity are numb or tingling, appear gray or blue, or you have severe pain (also elevate the extremity and loosen splint or wrap if you were given one) Please return if you have redness or fevers.  Please return to the Emergency Department if you experience worsening symptoms.  Please return if you have any other emergent concerns. Additional Information:  Your vital signs today were: BP (!) 147/85   Pulse 83   Temp 98.6 F (37 C) (Oral)   Resp 18   Ht 5\' 8"  (1.727 m)   Wt (!) 140.6 kg   LMP 06/12/2022   SpO2 100%   BMI 47.14 kg/m  If your blood pressure (BP) was elevated above 135/85 this visit, please have this repeated by your doctor within one month. ---------------

## 2022-06-27 NOTE — ED Provider Notes (Signed)
[facility]-EMERGENCY DEPT Provider Note   CSN: [id number] Arrival date & time: 06/26/22  1936     History  Chief Complaint  Patient presents with   Ankle Pain    Brandi Manning is a 26 y.o. female who presents to the emergency department with complaints of right lower extremity injury that occurred shortly prior to arrival today.  Patient states she was walking, missed stepped on a rock, and subsequently inverted the right ankle leading to a fall onto her right side.  Denies head injury or loss of consciousness.  She is having pain primarily to the right ankle however she is having pain up to the knee/distal thigh.  Worse with movement.  No alleviating factors.  Having associated swelling.  She does have some paresthesias.  She denies open wounds, fever, chills, chest pain, or abdominal pain.  Denies chance of pregnancy.  HPI     Home Medications Prior to Admission medications   Medication Sig Start Date End Date Taking? Authorizing Provider  FLUoxetine (PROZAC) 20 MG capsule Take 1 capsule (20 mg total) by mouth at bedtime. 11/10/19   Cirigliano, Jearld Lesch, DO  ibuprofen (ADVIL) 600 MG tablet Take 1 tablet (600 mg total) by mouth every 8 (eight) hours as needed. 05/21/20   Cirigliano, Jearld Lesch, DO  Norgestimate-Ethinyl Estradiol Triphasic (TRI-SPRINTEC) 0.18/0.215/0.25 MG-35 MCG tablet Take 1 tablet by mouth daily. 11/10/19   CiriglianoJearld Lesch, DO      Allergies    Patient has no known allergies.    Review of Systems   Review of Systems  Constitutional:  Negative for chills and fever.  Respiratory:  Negative for shortness of breath.   Cardiovascular:  Negative for chest pain.  Gastrointestinal:  Negative for abdominal pain.  Musculoskeletal:  Positive for arthralgias, joint swelling and myalgias.  Neurological:  Positive for numbness (paresthesias).  All other systems reviewed and are negative.   Physical Exam Updated Vital Signs BP (!) 147/85   Pulse 83    Temp 98.6 F (37 C) (Oral)   Resp 18   Ht 5\' 8"  (1.727 m)   Wt (!) 140.6 kg   LMP 06/12/2022   SpO2 100%   BMI 47.14 kg/m  Physical Exam Vitals and nursing note reviewed.  Constitutional:      General: She is not in acute distress.    Appearance: She is not ill-appearing or toxic-appearing.  HENT:     Head: Normocephalic and atraumatic.  Cardiovascular:     Rate and Rhythm: Normal rate and regular rhythm.     Pulses:          Dorsalis pedis pulses are 2+ on the right side and 2+ on the left side.       Posterior tibial pulses are 2+ on the right side and 2+ on the left side.  Pulmonary:     Effort: Pulmonary effort is normal.  Chest:     Chest wall: No tenderness.  Abdominal:     General: There is no distension.     Palpations: Abdomen is soft.     Tenderness: There is no abdominal tenderness.  Musculoskeletal:     Comments: Lower extremities: Patient has soft tissue swelling to the right lateral ankle.  No significant open wounds or ecchymosis at this time.  Intact range of motion of the knee, hip, and able to move all digits.  Right ankle with limited range of motion however she is able to plantar and dorsiflex.  She is  tender palpation primarily over the right lateral malleolus and lateral ankle ligaments, however she is also tender to the lateral foot including the base of the fifth metatarsal, the fibula up to the fibular head to the lateral knee, and the lateral femoral condyle of the right lower extremity.  Otherwise no tenderness to palpation noted.  Compartments are soft. Back: No midline tenderness.  Skin:    General: Skin is warm and dry.     Capillary Refill: Capillary refill takes less than 2 seconds.  Neurological:     Mental Status: She is alert.     Comments: Alert. Clear speech. Sensation grossly intact to light touch to bilateral lower extremities.  Able to plantar/dorsiflex against resistance.  Psychiatric:        Mood and Affect: Mood normal.         Behavior: Behavior normal.     ED Results / Procedures / Treatments   Labs (all labs ordered are listed, but only abnormal results are displayed) Labs Reviewed - No data to display  EKG None  Radiology DG Knee Complete 4 Views Right  Result Date: 06/27/2022 CLINICAL DATA:  Right lower extremity injury. EXAM: RIGHT KNEE - COMPLETE 4+ VIEW; RIGHT FOOT COMPLETE - 3+ VIEW; RIGHT TIBIA AND FIBULA - 2 VIEW COMPARISON:  None Available. FINDINGS: Right knee AP, cross-table lateral and both obliques: No evidence of fracture, dislocation, or joint effusion. No evidence of arthropathy or acute bone abnormality. 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 Soft tissues are unremarkable. Right tibia fibula series AP Lat: No acute fracture or focal bone lesion is seen. There is diffuse soft tissue prominence at the ankle. Right foot, three views: There is diffuse soft tissue prominence. Normal bone mineralization. No evidence of fracture, dislocation or degenerative change. Interosseous alignment is normal.  There is no calcaneal spurring. IMPRESSION: No evidence of fractures about the right knee. 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 Prominent soft tissues in the ankle and foot without evidence of fractures. Electronically Signed   By: Telford Nab M.D.   On: 06/27/2022 01:15   DG Tibia/Fibula Right  Result Date: 06/27/2022 CLINICAL DATA:  Right lower extremity injury. EXAM: RIGHT KNEE - COMPLETE 4+ VIEW; RIGHT FOOT COMPLETE - 3+ VIEW; RIGHT TIBIA AND FIBULA - 2 VIEW COMPARISON:  None Available. FINDINGS: Right knee AP, cross-table lateral and both obliques: No evidence of fracture, dislocation, or joint effusion. No evidence of arthropathy or acute bone abnormality. There is an ossicle in the distal patellar tendon which could be due to remote trauma or old Osgood-Schlatter  disease. Soft tissues are unremarkable. Right tibia fibula series AP Lat: No acute fracture or focal bone lesion is seen. There is diffuse soft tissue prominence at the ankle. Right foot, three views: There is diffuse soft tissue prominence. Normal bone mineralization. No evidence of fracture, dislocation or degenerative change. Interosseous alignment is normal.  There is no calcaneal spurring. IMPRESSION: No evidence of fractures about the right knee. 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 Prominent soft tissues in the ankle and foot without evidence of fractures. Electronically Signed   By: Telford Nab M.D.   On: 06/27/2022 01:15   DG Foot Complete Right  Result Date: 06/27/2022 CLINICAL DATA:  Right lower extremity injury. EXAM: RIGHT KNEE - COMPLETE 4+ VIEW; RIGHT FOOT COMPLETE -  3+ VIEW; RIGHT TIBIA AND FIBULA - 2 VIEW COMPARISON:  None Available. FINDINGS: Right knee AP, cross-table lateral and both obliques: No evidence of fracture, dislocation, or joint effusion. No evidence of arthropathy or acute bone abnormality. 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 Soft tissues are unremarkable. Right tibia fibula series AP Lat: No acute fracture or focal bone lesion is seen. There is diffuse soft tissue prominence at the ankle. Right foot, three views: There is diffuse soft tissue prominence. Normal bone mineralization. No evidence of fracture, dislocation or degenerative change. Interosseous alignment is normal.  There is no calcaneal spurring. IMPRESSION: No evidence of fractures about the right knee. 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 Prominent soft tissues in the ankle and foot without evidence of fractures. Electronically Signed   By: Almira Bar M.D.   On: 06/27/2022 01:15   DG Ankle Complete  Right  Result Date: 06/26/2022 CLINICAL DATA:  Right ankle pain. EXAM: RIGHT ANKLE - COMPLETE 3+ VIEW COMPARISON:  None Available. FINDINGS: There is no acute fracture or dislocation. The bones are well mineralized. No arthritic changes. The ankle mortise is intact. The soft tissues are unremarkable. IMPRESSION: Negative. Electronically Signed   By: Elgie Collard M.D.   On: 06/26/2022 20:56    Procedures Procedures    Medications Ordered in ED Medications  oxyCODONE-acetaminophen (PERCOCET/ROXICET) 5-325 MG per tablet 1 tablet (1 tablet Oral Given 06/27/22 0057)    ED Course/ Medical Decision Making/ A&P                           Medical Decision Making Amount and/or Complexity of Data Reviewed Radiology: ordered.  Risk Prescription drug management.   Patient presents to the ED with complaints of RLE injury.  Nontoxic, vitals w/ elevated BP (220s/130s not accurate per NT).   Chart/nursing note reviewed for additional history.  Ordered Percocet for pain.  I ordered imaging in addition to the right ankle x-ray ordered in triage, I viewed and interpreted all images, agree with radiologist: Right ankle x-ray: Negative Right knee/tib/fib/foot: No evidence of fractures about the right knee. 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 Prominent soft tissues in the ankle and foot without evidence of fractures  No findings of fractures/dislocation.  NVI distally. Compartments are soft. No open wounds.  No other findings of injury. Will place in CAM walker and provide crutches. NSAIDs, PRICE, ortho follow up. I discussed results, treatment plan, need for follow-up, and return precautions with the patient. Provided opportunity for questions, patient confirmed understanding and is in agreement with plan.    Final Clinical Impression(s) / ED Diagnoses Final diagnoses:  Pain of right lower extremity due to injury    Rx / DC  Orders ED Discharge Orders          Ordered    naproxen (EC-NAPROSYN) 500 MG EC tablet  2 times daily PRN        06/27/22 0148              Adylin Hankey, Pleas Koch, PA-C 06/27/22 0211    Nira Conn, MD 06/27/22 [phone]

## 2023-01-01 ENCOUNTER — Ambulatory Visit: Payer: 59 | Admitting: Family

## 2023-01-15 ENCOUNTER — Encounter: Payer: Self-pay | Admitting: Family

## 2023-01-15 ENCOUNTER — Ambulatory Visit: Payer: 59 | Admitting: Family

## 2023-01-15 VITALS — BP 140/90 | HR 76 | Ht 68.0 in | Wt 322.0 lb

## 2023-01-15 DIAGNOSIS — Z3009 Encounter for other general counseling and advice on contraception: Secondary | ICD-10-CM | POA: Diagnosis not present

## 2023-01-15 DIAGNOSIS — R635 Abnormal weight gain: Secondary | ICD-10-CM

## 2023-01-15 DIAGNOSIS — F331 Major depressive disorder, recurrent, moderate: Secondary | ICD-10-CM | POA: Insufficient documentation

## 2023-01-15 DIAGNOSIS — R030 Elevated blood-pressure reading, without diagnosis of hypertension: Secondary | ICD-10-CM

## 2023-01-15 DIAGNOSIS — E049 Nontoxic goiter, unspecified: Secondary | ICD-10-CM

## 2023-01-15 DIAGNOSIS — Z7689 Persons encountering health services in other specified circumstances: Secondary | ICD-10-CM | POA: Diagnosis not present

## 2023-01-15 DIAGNOSIS — Z6841 Body Mass Index (BMI) 40.0 and over, adult: Secondary | ICD-10-CM

## 2023-01-15 DIAGNOSIS — Z13220 Encounter for screening for lipoid disorders: Secondary | ICD-10-CM

## 2023-01-15 DIAGNOSIS — Z0000 Encounter for general adult medical examination without abnormal findings: Secondary | ICD-10-CM

## 2023-01-15 HISTORY — DX: Major depressive disorder, recurrent, moderate: F33.1

## 2023-01-15 LAB — COMPREHENSIVE METABOLIC PANEL
ALT: 7 U/L (ref 0–35)
AST: 14 U/L (ref 0–37)
Albumin: 4.3 g/dL (ref 3.5–5.2)
Alkaline Phosphatase: 74 U/L (ref 39–117)
BUN: 13 mg/dL (ref 6–23)
CO2: 26 mEq/L (ref 19–32)
Calcium: 9.4 mg/dL (ref 8.4–10.5)
Chloride: 104 mEq/L (ref 96–112)
Creatinine, Ser: 0.92 mg/dL (ref 0.40–1.20)
GFR: 85.01 mL/min (ref >60.00)
Glucose, Bld: 77 mg/dL (ref 70–99)
Potassium: 4.4 mEq/L (ref 3.5–5.1)
Sodium: 138 mEq/L (ref 135–145)
Total Bilirubin: 0.5 mg/dL (ref 0.2–1.2)
Total Protein: 7 g/dL (ref 6.0–8.3)

## 2023-01-15 LAB — CBC WITH DIFFERENTIAL/PLATELET
Basophils Absolute: 0 10*3/uL (ref 0.0–0.1)
Basophils Relative: 0.8 % (ref 0.0–3.0)
Eosinophils Absolute: 0.1 10*3/uL (ref 0.0–0.7)
Eosinophils Relative: 1.9 % (ref 0.0–5.0)
HCT: 42.6 % (ref 36.0–46.0)
Hemoglobin: 13.8 g/dL (ref 12.0–15.0)
Lymphocytes Relative: 48.9 % — ABNORMAL HIGH (ref 12.0–46.0)
Lymphs Abs: 2.6 10*3/uL (ref 0.7–4.0)
MCHC: 32.4 g/dL (ref 30.0–36.0)
MCV: 85.7 fl (ref 78.0–100.0)
Monocytes Absolute: 0.5 10*3/uL (ref 0.1–1.0)
Monocytes Relative: 8.9 % (ref 3.0–12.0)
Neutro Abs: 2.1 10*3/uL (ref 1.4–7.7)
Neutrophils Relative %: 39.5 % — ABNORMAL LOW (ref 43.0–77.0)
Platelets: 344 10*3/uL (ref 150.0–400.0)
RBC: 4.97 Mil/uL (ref 3.87–5.11)
RDW: 14.5 % (ref 11.5–15.5)
WBC: 5.3 10*3/uL (ref 4.0–10.5)

## 2023-01-15 LAB — LIPID PANEL
Cholesterol: 166 mg/dL (ref 0–200)
HDL: 37 mg/dL — ABNORMAL LOW (ref >39.00)
LDL Cholesterol: 111 mg/dL — ABNORMAL HIGH (ref 0–99)
NonHDL: 128.86
Total CHOL/HDL Ratio: 4
Triglycerides: 89 mg/dL (ref 0.0–149.0)
VLDL: 17.8 mg/dL (ref 0.0–40.0)

## 2023-01-15 LAB — HEMOGLOBIN A1C: Hgb A1c MFr Bld: 5.8 % (ref 4.6–6.5)

## 2023-01-15 LAB — TSH: TSH: 2.04 u[IU]/mL (ref 0.35–5.50)

## 2023-01-15 NOTE — Progress Notes (Addendum)
Brandi Manning is a 27 y.o. female with the following history as recorded in EpicCare:  Patient Active Problem List   Diagnosis Date Noted   Moderate episode of recurrent major depressive disorder (HCC) 01/15/2023   Hypertriglyceridemia 06/28/2020   Morning headache 03/19/2017   Abdominal discomfort 03/19/2017   Snoring 03/19/2017   Encounter for contraceptive management 01/08/2017   Skin lesion 01/08/2017   Blister 01/08/2017   Vaginal irritation 07/29/2016   Dysuria 07/29/2016   Depression 04/17/2016   Relationship problem with parent 04/17/2016   Encounter for health maintenance examination in adult 01/16/2016   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   Screen for STD (sexually transmitted disease) 01/16/2016   Screening for cervical cancer 01/16/2016   Depressed mood 11/16/2014   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   Goiter 11/16/2014   Morbid obesity (HCC) 11/16/2014    Current Outpatient Medications  Medication Sig Dispense Refill   Prenatal Vit-Fe Fumarate-FA (PRENATAL VITAMIN PO) Take by mouth.     No current facility-administered medications for this visit.    Allergies: Patient has no known allergies.  Past Medical History:  Diagnosis Date   Allergy    seasonal   Headache    intermittent   Wears glasses     Past Surgical History:  Procedure Laterality Date   WISDOM TOOTH EXTRACTION      Family History  Problem Relation Age of Onset   Hypertension Mother    Arthritis Mother    Heart disease Mother        ?   Diabetes Maternal Grandmother    Heart disease Paternal Grandfather     Social History   Tobacco Use   Smoking status: Never   Smokeless tobacco: Never  Substance Use Topics   Alcohol use: Yes    Comment: occ    Subjective:   Presents today as a new patient; would like to get referred to OB-GYN- hoping to get pregnant soon;  Has been told that blood pressure is elevated- does check at home and notes that readings at home have been elevated as well; "  I get nervous at doctor's office."  + headache; weight is up 12 pounds since October 2023; trying to exercise 2 x per week;     Objective:  Vitals:   01/15/23 0916 01/15/23 1204  BP: (!) 148/96 (!) 140/90  Pulse: 76   SpO2: 100%   Weight: (!) 322 lb (146.1 kg)   Height: 5\' 8"  (1.727 m)     General: Well developed, well nourished, in no acute distress  Skin : Warm and dry.  Head: Normocephalic and atraumatic  Eyes: Sclera and conjunctiva clear; pupils round and reactive to light; extraocular movements intact  Ears: External normal; canals clear; tympanic membranes normal  Oropharynx: Pink, supple. No suspicious lesions  Neck: Supple without thyromegaly, adenopathy  Lungs: Respirations unlabored; clear to auscultation bilaterally without wheeze, rales, rhonchi  CVS exam: normal rate and regular rhythm.  Abdomen: Soft; nontender; nondistended; normoactive bowel sounds; no masses or hepatosplenomegaly  Musculoskeletal: No deformities; no active joint inflammation  Extremities: No edema, cyanosis, clubbing  Vessels: Symmetric bilaterally  Neurologic: Alert and oriented; speech intact; face symmetrical; moves all extremities well; CNII-XII intact without focal deficit   Assessment:  1. PE (physical exam), annual   2. Encounter to establish care   3. Family planning   4. Elevated blood pressure reading   5. Lipid screening   6. Weight gain   7. BMI 45.0-49.9, adult (HCC)   8. Enlarged thyroid  9. Moderate episode of recurrent major depressive disorder (HCC)     Plan:   Age appropriate preventive healthcare needs addressed; encouraged regular eye doctor and dental exams; encouraged regular exercise; will update labs and refills as needed today; follow-up to be determined; Referral to OB-GYN to establish care; patient is already taking pre-natal vitamins; she will plan to get her pap smear updated with GYN; DASH diet discussed; patient to start checking blood pressure at home and  bring log/ machine to next OV; encouraged to work on limiting intake of fast food/ work on continued exercise goals;  Will update thyroid ultrasound; Patient is planning to meet with tele-doc provider through her employer for treatment for depression/ anxiety;   Follow up in 2-3 weeks;    No follow-ups on file.  Orders Placed This Encounter  Procedures   US THYROID    Standing Status:   Future    Standing Expiration Date:   01/15/2024    Order Specific Question:   Reason for Exam (SYMPTOM  OR DIAGNOSIS REQUIRED)    Answer:   enlarged thyroid    Order Specific Question:   Preferred imaging location?    Answer:   [facility]   CBC with Differential/Platelet   Comp Met (CMET)   Lipid panel   TSH   Hemoglobin A1c   Ambulatory referral to Obstetrics / Gynecology    Referral Priority:   Routine    Referral Type:   Consultation    Referral Reason:   Specialty Services Required    Requested Specialty:   Obstetrics and Gynecology    Number of Visits Requested:   1   Amb Referral to Nutrition and Diabetic Education    Referral Priority:   Routine    Referral Type:   Consultation    Referral Reason:   Specialty Services Required    Number of Visits Requested:   1    Requested Prescriptions    No prescriptions requested or ordered in this encounter

## 2023-01-23 ENCOUNTER — Ambulatory Visit (HOSPITAL_BASED_OUTPATIENT_CLINIC_OR_DEPARTMENT_OTHER)
Admission: RE | Admit: 2023-01-23 | Discharge: 2023-01-23 | Disposition: A | Discharge status: Home / Self Care | Payer: 59 | Source: Ambulatory Visit | Attending: Family | Admitting: Family

## 2023-01-23 DIAGNOSIS — E049 Nontoxic goiter, unspecified: Secondary | ICD-10-CM | POA: Diagnosis not present

## 2023-01-25 ENCOUNTER — Other Ambulatory Visit: Payer: Self-pay | Admitting: Family

## 2023-01-25 DIAGNOSIS — E041 Nontoxic single thyroid nodule: Secondary | ICD-10-CM

## 2023-01-29 ENCOUNTER — Ambulatory Visit: Payer: 59 | Admitting: Family

## 2023-02-23 ENCOUNTER — Ambulatory Visit
Admission: RE | Admit: 2023-02-23 | Discharge: 2023-02-23 | Disposition: A | Discharge status: Home / Self Care | Payer: 59 | Source: Ambulatory Visit | Attending: Family | Admitting: Family

## 2023-02-23 DIAGNOSIS — E041 Nontoxic single thyroid nodule: Secondary | ICD-10-CM

## 2023-02-23 NOTE — Progress Notes (Signed)
Patient presented to the outpatient clinic today for a right inferior thyroid nodule biopsy. Nodule was very difficult to visualize and hard to distinguish from surrounding tissue. Nodule also very deep. After a lengthy ultrasound evaluation trying to localize and determine needle trajectory I explained to the patient that I felt she would be best served by having a radiologist perform her procedure. Patient verbalized understanding and was in agreement to reschedule procedure.   Patient will be rescheduled on a day when an IR MD is present. Dr. Miles Costain made aware.  Alwyn Ren, [place] [phone] 02/23/2023, 4:36 PM

## 2023-02-26 ENCOUNTER — Other Ambulatory Visit: Payer: Self-pay | Admitting: Family

## 2023-02-26 ENCOUNTER — Encounter: Payer: Self-pay | Admitting: Family

## 2023-02-26 ENCOUNTER — Ambulatory Visit: Payer: 59 | Admitting: Family

## 2023-02-26 VITALS — BP 138/88 | HR 86 | Ht 68.0 in | Wt 320.4 lb

## 2023-02-26 DIAGNOSIS — F32A Depression, unspecified: Secondary | ICD-10-CM

## 2023-02-26 DIAGNOSIS — Z6841 Body Mass Index (BMI) 40.0 and over, adult: Secondary | ICD-10-CM

## 2023-02-26 DIAGNOSIS — E041 Nontoxic single thyroid nodule: Secondary | ICD-10-CM | POA: Diagnosis not present

## 2023-02-26 DIAGNOSIS — F419 Anxiety disorder, unspecified: Secondary | ICD-10-CM

## 2023-02-26 DIAGNOSIS — R030 Elevated blood-pressure reading, without diagnosis of hypertension: Secondary | ICD-10-CM | POA: Diagnosis not present

## 2023-02-26 MED ORDER — ESCITALOPRAM OXALATE 10 MG PO TABS: 10.0000 mg | ORAL_TABLET | Freq: Every day | ORAL | 0 refills | Status: DC

## 2023-02-26 NOTE — Progress Notes (Signed)
Brandi Manning is a 27 y.o. female with the following history as recorded in EpicCare:  Patient Active Problem List   Diagnosis Date Noted   Moderate episode of recurrent major depressive disorder (HCC) 01/15/2023   Hypertriglyceridemia 06/28/2020   Morning headache 03/19/2017   Abdominal discomfort 03/19/2017   Snoring 03/19/2017   Encounter for contraceptive management 01/08/2017   Skin lesion 01/08/2017   Blister 01/08/2017   Vaginal irritation 07/29/2016   Dysuria 07/29/2016   Depression 04/17/2016   Relationship problem with parent 04/17/2016   Encounter for health maintenance examination in adult 01/16/2016   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   Screen for STD (sexually transmitted disease) 01/16/2016   Screening for cervical cancer 01/16/2016   Depressed mood 11/16/2014   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   Goiter 11/16/2014   Morbid obesity (HCC) 11/16/2014    Current Outpatient Medications  Medication Sig Dispense Refill   escitalopram (LEXAPRO) 10 MG tablet Take 1 tablet (10 mg total) by mouth daily. 90 tablet 0   Prenatal Vit-Fe Fumarate-FA (PRENATAL VITAMIN PO) Take by mouth. (Patient not taking: Reported on 02/26/2023)     No current facility-administered medications for this visit.    Allergies: Patient has no known allergies.  Past Medical History:  Diagnosis Date   Allergy    seasonal   Headache    intermittent   Wears glasses     Past Surgical History:  Procedure Laterality Date   WISDOM TOOTH EXTRACTION      Family History  Problem Relation Age of Onset   Hypertension Mother    Arthritis Mother    Heart disease Mother        ?   Diabetes Maternal Grandmother    Heart disease Paternal Grandfather     Social History   Tobacco Use   Smoking status: Never   Smokeless tobacco: Never  Substance Use Topics   Alcohol use: Yes    Comment: occ    Subjective:   Presents to follow up; since last OV, has been referred for thyroid biopsy but  unfortunately this was not able to be completed- per patient, this appointment has to be re-scheduled;  Continuing to struggle with anxiety/ depression- has started doing therapy with her pastor; would like to consider medication- has been prescribed Fluoxetine in the past but admits did not take regularly;  Has been monitoring blood pressure- fluctuates/ seems to relate to stress/ anxiety; readings do show that had entire of normal readings;   Objective:  Vitals:   02/26/23 0807  BP: 138/88  Pulse: 86  SpO2: 98%  Weight: (!) 320 lb 6.4 oz (145.3 kg)  Height: 5\' 8"  (1.727 m)    General: Well developed, well nourished, in no acute distress  Skin : Warm and dry.  Head: Normocephalic and atraumatic  Eyes: Sclera and conjunctiva clear; pupils round and reactive to light; extraocular movements intact  Ears: External normal; canals clear; tympanic membranes normal  Oropharynx: Pink, supple. No suspicious lesions  Neck: Supple without thyromegaly, adenopathy  Lungs: Respirations unlabored; clear to auscultation bilaterally without wheeze, rales, rhonchi  CVS exam: normal rate and regular rhythm.  Neurologic: Alert and oriented; speech intact; face symmetrical; moves all extremities well; CNII-XII intact without focal deficit   Assessment:  1. Anxiety and depression   2. Morbid obesity (HCC)   3. Thyroid nodule   4. Elevated blood pressure reading     Plan:  Trial of Lexapro 10 mg daily; risks/ benefits discussed; she will continue working with her  pastor for counseling- do feel this will be very beneficial for her; follow up in 6 weeks, sooner prn. Continue to work on diet/ exercise; she has planned follow up with nutrition in July 2024; Keep planned follow up for thyroid biopsy; ? If blood pressure elevations are related to anxiety- home readings indicate entire weeks of normal readings; will continue to monitor- hope to see improvement as anxiety/ depression control improved;   Return  in about 6 weeks (around 04/09/2023).  No orders of the defined types were placed in this encounter.   Requested Prescriptions   Signed Prescriptions Disp Refills   escitalopram (LEXAPRO) 10 MG tablet 90 tablet 0    Sig: Take 1 tablet (10 mg total) by mouth daily.

## 2023-03-05 ENCOUNTER — Other Ambulatory Visit: Payer: Self-pay | Admitting: Family

## 2023-03-05 ENCOUNTER — Ambulatory Visit
Admission: RE | Admit: 2023-03-05 | Discharge: 2023-03-05 | Disposition: A | Discharge status: Home / Self Care | Payer: 59 | Source: Ambulatory Visit | Attending: Family | Admitting: Family

## 2023-03-05 DIAGNOSIS — E041 Nontoxic single thyroid nodule: Secondary | ICD-10-CM

## 2023-03-05 DIAGNOSIS — R221 Localized swelling, mass and lump, neck: Secondary | ICD-10-CM | POA: Diagnosis not present

## 2023-03-26 ENCOUNTER — Ambulatory Visit: Payer: 59 | Admitting: Dietician

## 2023-03-30 ENCOUNTER — Encounter: Payer: 59 | Admitting: Obstetrics & Gynecology

## 2023-04-02 ENCOUNTER — Ambulatory Visit: Payer: 59 | Admitting: Dietician

## 2023-04-09 ENCOUNTER — Ambulatory Visit: Payer: 59 | Admitting: Family

## 2023-05-03 DIAGNOSIS — J069 Acute upper respiratory infection, unspecified: Secondary | ICD-10-CM | POA: Diagnosis not present

## 2023-05-03 DIAGNOSIS — Z20822 Contact with and (suspected) exposure to covid-19: Secondary | ICD-10-CM | POA: Diagnosis not present

## 2023-05-24 ENCOUNTER — Other Ambulatory Visit: Payer: Self-pay | Admitting: Family

## 2023-07-12 ENCOUNTER — Encounter: Payer: Self-pay | Admitting: Family

## 2023-08-23 ENCOUNTER — Telehealth: Payer: Self-pay | Admitting: General Practice

## 2023-08-23 ENCOUNTER — Ambulatory Visit: Payer: 59 | Admitting: Family Medicine

## 2023-08-23 ENCOUNTER — Ambulatory Visit (HOSPITAL_BASED_OUTPATIENT_CLINIC_OR_DEPARTMENT_OTHER)
Admission: RE | Admit: 2023-08-23 | Discharge: 2023-08-23 | Disposition: A | Discharge status: Home / Self Care | Payer: 59 | Source: Ambulatory Visit | Attending: Family Medicine | Admitting: Family Medicine

## 2023-08-23 ENCOUNTER — Encounter: Payer: Self-pay | Admitting: Family Medicine

## 2023-08-23 ENCOUNTER — Other Ambulatory Visit: Payer: Self-pay

## 2023-08-23 VITALS — BP 127/74 | HR 87 | Ht 68.0 in | Wt 322.8 lb

## 2023-08-23 DIAGNOSIS — Z3201 Encounter for pregnancy test, result positive: Secondary | ICD-10-CM | POA: Diagnosis not present

## 2023-08-23 DIAGNOSIS — Z3491 Encounter for supervision of normal pregnancy, unspecified, first trimester: Secondary | ICD-10-CM | POA: Diagnosis not present

## 2023-08-23 DIAGNOSIS — Z3A01 Less than 8 weeks gestation of pregnancy: Secondary | ICD-10-CM | POA: Diagnosis not present

## 2023-08-23 DIAGNOSIS — O26891 Other specified pregnancy related conditions, first trimester: Secondary | ICD-10-CM | POA: Diagnosis not present

## 2023-08-23 DIAGNOSIS — D259 Leiomyoma of uterus, unspecified: Secondary | ICD-10-CM | POA: Diagnosis not present

## 2023-08-23 LAB — POCT PREGNANCY, URINE: Preg Test, Ur: POSITIVE — AB

## 2023-08-23 NOTE — Progress Notes (Signed)
Possible Pregnancy  Here today for pregnancy confirmation. UPT in office today is positive. Pt reports first positive home UPT on 08/14/23. Reviewed dating with patient:   LMP: 07/12/23 (approximate)--regular periods prior to pregnancy per patient  EDD: 04/17/24 6w 0d today  G1P0; no significant medical Hx or surgical hx; patient denies vaginal bleeding and vaginal pain. C/o some nausea.   Dating [place] scheduled for today at [facility] at 1245; patient made aware and confirmed that she will go.  OB history reviewed. Reviewed medications and allergies with patient; list of medications safe to take during pregnancy given.  Recommended pt begin prenatal vitamin and schedule prenatal care.  Meryl Crutch, RN 08/23/2023  8:23 AM   Chart reviewed for nurse visit. Agree with plan of care.  Went and met with patient.  Welcomed them to the practice and discussed that we work with the team of physicians, midwives, students, fellows.  Answer any questions they had.  Patient will have dating ultrasound and schedule new patient visit.  Celedonio Savage, MD 08/23/2023 9:52 AM

## 2023-08-23 NOTE — Patient Instructions (Signed)

## 2023-08-23 NOTE — Telephone Encounter (Signed)
Patient called and left message on nurse voicemail line stating she just left from having an ultrasound at [facility] and did not have a good experience at all. She states they were very rude to her and her fiancee. Patient requests all future ultrasounds be done somewhere else. She would like a call back.

## 2023-08-25 ENCOUNTER — Other Ambulatory Visit: Payer: 59

## 2023-08-25 NOTE — Telephone Encounter (Signed)
Called patient at number listed in chart--sent to identified VM--left message stating that I was calling her back regarding her experience with her [place] at [facility] and to give [place] a call back so we can discuss all her concerns/experience.   Maureen Ralphs RN on 08/25/23 at [phone]

## 2023-09-03 NOTE — Telephone Encounter (Signed)
Called and spoke with patient. She reports that she felt like [place] tech was rude. Fiance was not allowed to go back and that frustrated the patient and her fiance. She reports they were not able to see images and was not given pictures of baby. Apologized for above.   Per [place] report, patient needs follow up US. Will reach out to Dr. Nobie Putnam for advisement.   Patient would like to see images and not able to in MyChart. Reviewed can be shown at next visit.   Informed patient that follow up US,  if needed will be done [facility] office.

## 2023-09-08 NOTE — L&D Delivery Note (Cosign Needed Addendum)
   Delivery Note:   G1P0 at [redacted]w[redacted]d  Admitting diagnosis: Encounter for induction of labor [Z34.90] Polyhydramnios [O40.9XX0] Risks:  Patient Active Problem List   Diagnosis Date Noted   Elevated TSH 04/09/2024   IUFD at 20 weeks or more of gestation 04/09/2024   Gestational hypertension 04/09/2024   Encounter for induction of labor 04/08/2024   Polyhydramnios 03/16/2024   Anxiety and depression 02/16/2024   Obesity affecting pregnancy, antepartum 11/19/2023   Supervision of high risk pregnancy, antepartum 09/21/2023     First Stage:  Induction of labor:04/09/24 Onset of labor: 0524 Augmentation: AROM, Pitocin , and Cytotec  ROM: AROM @ 0524 on 8/3 Active labor onset: 04/09/24 Analgesia /Anesthesia/Pain control intrapartum: Epidural  Second Stage:  Complete dilation at 04/09/2024   Onset of pushing at 1830   CNM called to patient beside with Dr. Danny. Patient Pushing in lithotomy position with CNM and L&D staff support at patient bedside. FOB Elijah, patient's Mother, and Patient's father all present for birth and supportive.   Delivery female infant  Birth Weight: 7lbs 14oz   APGAR: 0, 0  Newborn Delivery   Birth date/time: 04/09/2024 20:25:00 Delivery type: Vaginal, Spontaneous    With great maternal pushing efforts fetal head slowly crowned up to +4 station in direct OA position. Fetal head delivered in cephalic presentation. Due to pressure on perineum maternal pushing efforts minimal and frantic. Minimal fetal movement with following contraction no restitution observed. Concern for Shoulder Dystocia and head of the bed put down. On next contraction MD attempted to visualize anterior shoulder but was unsuccessful. RN assisted patient in focusing. While awaiting next contraction CNM was able to grasp the posterior axilla with middle finger. On next contraction with great maternal pushing efforts fetus began to restitute to ROA position and posterior shoulder was delivered with  assistance underneath axilla. Remaining fetal body delivered with ease. Infant placed immediately on mom's chest.   After several mins of life cord double clamped after cessation of pulsation, and cut by FOB Elijah.  Collection of cord blood for typing completed. Cord blood donation-None Arterial cord blood sample-No   Third Stage:  With gentle cord traction and LUS massage Placenta delivered intact-Spontaneous;Pathology with 3 vessels by Dr. Danny. Uterine tone firm bleeding moderate. 2nd degree lac identified and pressure applied with  Uterotonics: IV pit bolus and TXA ordered.  Placenta to Path.  Deep 2nd degree laceration identified with some minor abrasions. Unable to get good visualization with bleeding. Dr. Izell called to patient bedside to assess Tear. Patient reported a mild headache during repair. She had received 625mg  of PO Tylenol  ~1.5hr to delivery. Maternal Temp 101.9. Another 625mg  of PO Tylenol  ordered and Cyndia currently going in.  Episiotomy:None Local analgesia: 1% lidocaine  given to aid in maternal discomfort and in efforts to see better. Somewhat successful.   Repair: *please see MD note for details*  Est. Blood Loss (mL):900 Complications: Stillborn   Mom to postpartum.  Baby girl to [place].  Delivery Report:  Review the Delivery Report for details.    Brandi Manning) Emilio, MSN, CNM  [facility]  04/09/24 9:48 PM

## 2023-09-10 ENCOUNTER — Telehealth: Payer: Self-pay | Admitting: Family

## 2023-09-10 NOTE — Telephone Encounter (Signed)
-----   Message from Olive Bass sent at 03/08/2023  9:47 AM EDT ----- Needs repeat thyroid ultrasound

## 2023-09-10 NOTE — Telephone Encounter (Signed)
 Called pt and left a VM relaying message to pt, asked pt to give the office a call back.

## 2023-09-10 NOTE — Telephone Encounter (Signed)
 Congratulations on her pregnancy!! We had discussed getting a repeat thyroid  ultrasound as a 6 month follow up. I am happy to order this but appreciate she may want to discuss with her OB also. There should not be any risk to doing a thyroid  ultrasound with pregnancy.

## 2023-09-14 ENCOUNTER — Encounter: Payer: 59 | Admitting: Obstetrics & Gynecology

## 2023-09-15 NOTE — Telephone Encounter (Signed)
 Called pt again and left a VM asking pt to give the office a call back.

## 2023-09-16 ENCOUNTER — Other Ambulatory Visit: Payer: Self-pay | Admitting: *Deleted

## 2023-09-16 DIAGNOSIS — O3680X0 Pregnancy with inconclusive fetal viability, not applicable or unspecified: Secondary | ICD-10-CM

## 2023-09-16 NOTE — Telephone Encounter (Addendum)
 Response received from Dr. Cresenzo who requested follow up US  to be scheduled @ 10-[redacted] wks gestation. I called and spoke w/pt. I informed her that I have scheduled follow up US  on 09/24/23 @ 1:30 pm @ [facility]. She will be [redacted] wks EGA on that Erhard Senske per US  on 08/23/23. Pt was instructed to arrive @ 1:15 with a full bladder. She will be scheduled for prenatal care following review of her results.  Pt voiced understanding.

## 2023-09-20 NOTE — Telephone Encounter (Signed)
Pt has viewed Mychart message.

## 2023-09-21 ENCOUNTER — Ambulatory Visit (INDEPENDENT_AMBULATORY_CARE_PROVIDER_SITE_OTHER): Payer: 59

## 2023-09-21 ENCOUNTER — Other Ambulatory Visit: Payer: Self-pay | Admitting: Obstetrics and Gynecology

## 2023-09-21 ENCOUNTER — Encounter: Payer: Self-pay | Admitting: Lactation Services

## 2023-09-21 ENCOUNTER — Other Ambulatory Visit: Payer: Self-pay

## 2023-09-21 ENCOUNTER — Other Ambulatory Visit (HOSPITAL_COMMUNITY)
Admission: RE | Admit: 2023-09-21 | Discharge: 2023-09-21 | Disposition: A | Discharge status: Home / Self Care | Payer: 59 | Source: Ambulatory Visit | Attending: Family Medicine | Admitting: Family Medicine

## 2023-09-21 VITALS — BP 120/82 | HR 90 | Wt 318.7 lb

## 2023-09-21 DIAGNOSIS — Z3A10 10 weeks gestation of pregnancy: Secondary | ICD-10-CM

## 2023-09-21 DIAGNOSIS — Z3491 Encounter for supervision of normal pregnancy, unspecified, first trimester: Secondary | ICD-10-CM

## 2023-09-21 DIAGNOSIS — Z3490 Encounter for supervision of normal pregnancy, unspecified, unspecified trimester: Secondary | ICD-10-CM

## 2023-09-21 DIAGNOSIS — Z31430 Encounter of female for testing for genetic disease carrier status for procreative management: Secondary | ICD-10-CM | POA: Diagnosis not present

## 2023-09-21 DIAGNOSIS — Z3481 Encounter for supervision of other normal pregnancy, first trimester: Secondary | ICD-10-CM | POA: Insufficient documentation

## 2023-09-21 DIAGNOSIS — O0990 Supervision of high risk pregnancy, unspecified, unspecified trimester: Secondary | ICD-10-CM | POA: Insufficient documentation

## 2023-09-21 LAB — HEMOGLOBIN A1C
Est. average glucose Bld gHb Est-mCnc: 117 mg/dL
Hgb A1c MFr Bld: 5.7 % — ABNORMAL HIGH (ref 4.8–5.6)

## 2023-09-21 NOTE — Progress Notes (Addendum)
 New OB Intake  I connected with Brandi Manning  on 09/21/23 at  9:15 AM EST in person and verified that I am speaking with the correct person using two identifiers. Nurse is located at [facility] and pt is located at [facility].  I discussed the limitations, risks, security and privacy concerns of performing an evaluation and management service by telephone and the availability of in person appointments. I also discussed with the patient that there may be a patient responsible charge related to this service. The patient expressed understanding and agreed to proceed.  I explained I am completing New OB Intake today. We discussed EDD of Not found.. Pt is G1P0. I reviewed her allergies, medications and Medical/Surgical/OB history.    Patient Active Problem List   Diagnosis Date Noted   Moderate episode of recurrent major depressive disorder (HCC) 01/15/2023   Hypertriglyceridemia 06/28/2020   Morning headache 03/19/2017   Abdominal discomfort 03/19/2017   Snoring 03/19/2017   Encounter for contraceptive management 01/08/2017   Skin lesion 01/08/2017   Blister 01/08/2017   Vaginal irritation 07/29/2016   Dysuria 07/29/2016   Depression 04/17/2016   Relationship problem with parent 04/17/2016   Encounter for health maintenance examination in adult 01/16/2016   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   Screening for cervical cancer 01/16/2016   Depressed mood 11/16/2014   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   Goiter 11/16/2014   Morbid obesity (HCC) 11/16/2014    Concerns addressed today  Delivery Plans Plans to deliver at [facility]. Discussed the nature of our practice with multiple providers including residents and students. Due to the size of the practice, the delivering provider may not be the same as those providing prenatal care.   Patient is interested in water birth. Offered upcoming OB visit with CNM to discuss further.  MyChart/Babyscripts MyChart access verified. I explained pt will have  some visits in office and some virtually. Babyscripts instructions given and order placed. Patient verifies receipt of registration text/e-mail. Account successfully created and app downloaded. If patient is a candidate for Optimized scheduling, add to sticky note.   Blood Pressure Cuff/Weight Scale Patient has private insurance; instructed to purchase blood pressure cuff and bring to first prenatal appt. Explained after first prenatal appt pt will check weekly and document in Babyscripts.  Anatomy US  Explained first scheduled US  will be around 19 weeks. Anatomy US  scheduled for 11/29/23 at 9:15am.  Is patient a CenteringPregnancy candidate?  Declined Declined due to Enrolled in [facility]   Is patient a Mom+Baby Combined Care candidate?  Accepted   If accepted, confirm patient does not intend to move from the area for at least 12 months, then notify Mom+Baby staff  Interested in [place]? If yes, send referral and doula dot phrase.   Is patient a candidate for Babyscripts Optimization? No, due to BMI    First visit review I reviewed new OB appt with patient. Explained pt will be seen by Nidia Daring, FNP at first visit. Discussed [place] genetic screening with patient. Panorama and Horizon.. Routine prenatal labs is not needed at new ob visit.  Last Pap Diagnosis  Date Value Ref Range Status  07/14/2019      - Negative for intraepithelial lesion or malignancy (NILM)    Brandi Manning, CMA 09/21/2023  9:20 AM

## 2023-09-21 NOTE — Patient Instructions (Signed)
Safe Medications in Pregnancy   Acne:  Benzoyl Peroxide  Salicylic Acid   Backache/Headache:  Tylenol: 2 regular strength every 4 hours OR               2 Extra strength every 6 hours   Colds/Coughs/Allergies:  Benadryl (alcohol free) 25 mg every 6 hours as needed  Breath right strips  Claritin  Cepacol throat lozenges  Chloraseptic throat spray  Cold-Eeze- up to three times per day  Cough drops, alcohol free  Flonase (by prescription only)  Guaifenesin  Mucinex  Robitussin DM (plain only, alcohol free)  Saline nasal spray/drops  Sudafed (pseudoephedrine) & Actifed * use only after [redacted] weeks gestation and if you do not have high blood pressure  Tylenol  Vicks Vaporub  Zinc lozenges  Zyrtec   Constipation:  Colace  Ducolax suppositories  Fleet enema  Glycerin suppositories  Metamucil  Milk of magnesia  Miralax  Senokot  Smooth move tea   Diarrhea:  Kaopectate  Imodium A-D   *NO pepto Bismol   Hemorrhoids:  Anusol  Anusol HC  Preparation H  Tucks   Indigestion:  Tums  Maalox  Mylanta  Zantac  Pepcid   Insomnia:  Benadryl (alcohol free) 25mg  every 6 hours as needed  Tylenol PM  Unisom, no Gelcaps   Leg Cramps:  Tums  MagGel   Nausea/Vomiting:  Bonine  Dramamine  Emetrol  Ginger extract  Sea bands  Meclizine  Nausea medication to take during pregnancy:  Unisom (doxylamine succinate 25 mg tablets) Take one tablet daily at bedtime. If symptoms are not adequately controlled, the dose can be increased to a maximum recommended dose of two tablets daily (1/2 tablet in the morning, 1/2 tablet mid-afternoon and one at bedtime).  Vitamin B6 100mg  tablets. Take one tablet twice a day (up to 200 mg per day).   Skin Rashes:  Aveeno products  Benadryl cream or 25mg  every 6 hours as needed  Calamine Lotion  1% cortisone cream   Yeast infection:  Gyne-lotrimin 7  Monistat 7    **If taking multiple medications, please check labels to avoid  duplicating the same active ingredients  **take medication as directed on the label  ** Do not exceed 4000 mg of tylenol in 24 hours  **Do not take medications that contain aspirin or ibuprofen             Considering Waterbirth? Guide for patients at Center for [employer] [facility]) Why consider waterbirth? Gentle birth for babies  Less pain medicine used in labor  May allow for passive descent/less pushing  May reduce perineal tears  More mobility and instinctive maternal position changes  Increased maternal relaxation   Is waterbirth safe? What are the risks of infection, drowning or other complications? Infection:  Very low risk (3.7 % for tub vs 4.8% for bed)  7 in 8000 waterbirths with documented infection  Poorly cleaned equipment most common cause  Slightly lower group B strep transmission rate  Drowning  Maternal:  Very low risk  Related to seizures or fainting  Newborn:  Very low risk. No evidence of increased risk of respiratory problems in multiple large studies  Physiological protection from breathing under water  Avoid underwater birth if there are any fetal complications  Once baby's head is out of the water, keep it out.  Birth complication  Some reports of cord trauma, but risk decreased by bringing baby to surface gradually  No evidence of increased risk of shoulder dystocia.  Mothers can usually change positions faster in water than in a bed, possibly aiding the maneuvers to free the shoulder.   There are 2 things you MUST do to have a waterbirth with [facility]: Attend a waterbirth class at [employer] [facility]   3rd Wednesday of every month from 7-9 pm (virtual during COVID) [employer] at [email or web address] or [email or web address] or by calling [phone] Bring [place] the certificate from the class to your prenatal appointment or send via MyChart Meet with a midwife at 36 weeks* to see if you can still plan a  waterbirth and to sign the consent.   *We also recommend that you schedule as many of your prenatal visits with a midwife as possible.    Helpful information: You may want to bring a bathing suit top to the hospital to wear during labor but this is optional.  All other supplies are provided by the hospital. Please arrive at the hospital with signs of active labor, and do not wait at home until late in labor. It takes 45 min- 1 hour for fetal monitoring, and check in to your room to take place, plus transport and filling of the waterbirth tub.    Things that would prevent you from having a waterbirth: Premature, <37wks  Previous cesarean birth  Presence of thick meconium-stained fluid  Multiple gestation (Twins, triplets, etc.)  Uncontrolled diabetes or gestational diabetes requiring medication  Hypertension diagnosed in pregnancy or preexisting hypertension (gestational hypertension, preeclampsia, or chronic hypertension) Fetal growth restriction (your baby measures less than 10th percentile on ultrasound) Heavy vaginal bleeding  Non-reassuring fetal heart rate  Active infection (MRSA, etc.). Group B Strep is NOT a contraindication for waterbirth.  If your labor has to be induced and induction method requires continuous monitoring of the baby's heart rate  Other risks/issues identified by your obstetrical provider   Please remember that birth is unpredictable. Under certain unforeseeable circumstances your provider may advise against giving birth in the tub. These decisions will be made on a case-by-case basis and with the safety of you and your baby as our highest priority.       [facility] Pediatric Providers  [place] ([id number]) [facility] Manson Passey, MD; Deirdre Priest, MD; Lum Babe, MD; Leveda Anna, MD; McDiarmid, MD; Jerene Bears, MD [address] [place], [place] [address] [phone] Mon-Fri 8:30-12:30, 1:30-5:00  Providers come to see babies  during newborn hospitalization Only accepting infants of Mother's who are seen at [facility] or have siblings seen at   [facility] Medicaid - Yes; Tricare - Yes   [facility] Scotts, MD [address] [place], [place] [address] [phone] Mon, Tue, Thur, Fri 8:30-5:00, Wed 10:00-7:00 (closed 1-2pm daily for lunch) [facility] residents with no insurance.  [place] [employer] only with Medicaid/insurance; Tricare - no  [facility]) - Tim and [facility], MD; Manson Passey, MD; Ave Filter, MD; Luna Fuse, MD; Kennedy Bucker, MD; Florestine Avers, MD; Melchor Amour, MD; Yetta Barre,  MD; Konrad Dolores, MD; Kathlene November, MD; Jenne Campus, MD; Wynetta Emery, MD; Duffy Rhody, MD; Gerre Couch, NP [address] [place], [place], [place] [address] [phone] Mon, Tue, Thur, Fri 8:30-5:30, Wed 9:30-5:30, Sat 8:30-12:30 Only accepting infants of first-time parents or siblings of current patients Hospital discharge coordinator will make follow-up appointment Medicaid - yes; Tricare - yes  [place] ([phone]) [facility] I[facility]an China, MD; Earlene Plater, MD; Jamesetta Orleans, MD; Alvera Novel, MD; Rana Snare, MD; Winn Jock, MD; Shaaron Adler, MD; Hosie Poisson, MD; [place],  MD [address] [place], [place] [address] [phone] Mon-Fri 8:30-5:00, closed for lunch 12:30-1:30; Sat-Sun 10:00-1:00 Accepting Newborns with commercial insurance only, must call prior to delivery to be accepted into  practice.  Medicaid - no, Tricare - yes   [facility] [address] [place], [place] [address] [phone] or [phone] Mon to Fri 8am to 10pm, Sat 8am to 1pm (virtual only on weekends) Only accepts Medicaid Healthy Blue pts  [facility] - Pediatrics at Elige Radon, MD; Sabino Dick, MD; Quitman Livings, MD; Betha Loa, NP; Claretha Cooper, MD; Lelon Perla, MD [address] [place] [place], [place] [address] [phone] Mon-Fri  8:30-5:30 Medicaid - yes, Tricare - yes  [place] [phone]) [facility] Marcie Mowers, MD [address], [place], [place] [address] [phone] Iona Hansen, Wed Fri 8:30-5:00, Sat 8:30-12:00, Closed Thursdays Accepting siblings of established patients and first time mom's if you call prenatally Medicaid- yes; Tricare - yes  [facility] Lutricia Feil, [place]; Tracie Harrier, MD; Rusty Aus; Scifres, PA; Wynelle Link, MD; Azucena Cecil, MD;  [address], [place], [place] [address] [phone] Mon-Fri 8:30-5:00, closed for lunch 1-2 Only accepting newborns of established patients Medicaid- no; Tricare - yes  [facility] [phone]) [facility] Morene Crocker, MD; [address], [place], [place] [address] [phone] Mon-Fri 8:00-5:00 Medicaid - No; Tricare - Yes  [facility], [place]; [place], [place] [address] [place], [place] [address] [phone] Mon-Fri 8:00-5:00 Medicaid - No, Tricare - Yes  [facility] Cardell Peach, MD; [place] Dimmer, MD; [place], [place] [address] [place], [place] [address] [phone]  Mon-Fri 8:00-5:00 Medicaid - No; Tricare - Yes  [facility] [address], [place], [place] [address] [phone] Mon-Fri 8:30-5:00 (lunch 12:00-1:00) Medicaid -Yes; Tricare - Yes  [facility] [place], MD [address] [place], [place], [place] [address] [phone] Mon-Fri 8:00-5:00 Seeing newborns of current patients only. No new patients Medicaid - No, Tricare - yes  Nature conservation officer at [place] [address] MD [address] [place], [place] [address] [phone] Mon-Fri 8:00-5:00 Medicaid -yes as secondary coverage only; Tricare - yes  [facility] [place], [place]; [place], [place]; Avis Epley, MD; Vonna Kotyk, MD; Clance Boll, MD; [place], [place]; Sm[place], NP; Vaughan Basta, MD; [place], MD [address] [place], [place]  [address] [phone] Mon-Fri 8:30-5:00, Sat 9:00-11:00 Accepts commercial insurance ONLY. Offers free prenatal information sessions for families. Medicaid - No, Tricare - Call first  [facility] [place], MD; [place], [place]; [place], [place]; [place], [place] [address] [place] [address] [phone] Mon-Fri 7:30-5:30 Medicaid - Yes; Ailene Rud yes  Berry [phone] & [phone])  [facility], MD [address], [place], [place] [address] [phone] Mon-Thur 8:00-6:00, closed for lunch 12-2, closed Fridays Medicaid - yes; Tricare - no  [facility] Dareen Piano, NP; Cyndia Bent, MD; [place], [place]; [place], [place] [address], [place], [place] [address] [phone] Mon-Fri 7:30-4:30 Medicaid - yes, Tricare - yes  [facility]  Juanito Doom, MD; Janene Harvey, NP; Vonita Moss, MD; Donn Pierini, NP [address], [place], [place] [address] [phone] Mon-Fri 8:30-5:00, closed for lunch 1-2, Sat 8:30-12:00 - sick visits only Providers come to see babies at [facility] Only accepting newborns of siblings and first time parents ONLY if who have met with office prior to delivery Medicaid -Yes; Tricare - yes  [facility] - [place], [place]; Friddle,  NP; Earlene Plater, MD; Lucretia Roers, MD:  [address], [place], [place] [address] [phone] Mon- Fri 8:00-5:00, Sat 9:00-12:00 - sick visits only Accepting siblings of established patients and first time mom/baby Medicaid - Yes; Tricare - yes Patients must have vaccinations (baby vaccines)  [place] [phone] & [phone])  Adult nurse HealthCare at [facility] [address] [place], [place] [address] [phone] Mon-Fri 8:00-5:00 Medicaid - no; Tricare - yes  [facility] [place], MD; [place], [place]; [place], [place] [address], [place], [place]  [address] [phone] Mon-Fri 8:00-5:00 Medicaid- yes; Tricare - yes  [facility] - Ardeen Jourdain, MD; Yetta Barre, NP; [place], [place] [address] [place], [place], [place] [address] [phone] Mon-Fri 8:00-5:00 Medicaid - Yes; Tricare - yes  [address] [place] [phone])  [facility] [place], [place]; [place], [place]; Eddie Candle, MD; Normand Sloop, MD; [place], NP; [place], DO; [place], [place]; Constance Goltz, MD; Ruthann Cancer, MD; Vear Clock, MD; [place], [place]; [place], [place]; [place], [place] 2025 [facility] [address], [place], [place] [address] [phone] Mon-Fri 8:30-5:00, Sat 9:00-12:00 - sick only Please register online [email or web address] then schedule online or call office Medicaid-Yes; Tricare -yes  [facility] - Premier  [place], MD; Romualdo Bolk, MD; [place], MD; [place], NP; [place], [place]; Antonietta Barcelona, MD; Mayford Knife, NP; Shelva Majestic, MD [address], [place], [place] [address] [phone] Mon-Fri 8:00-5:30, Sat&Sun by appointment (phones open at 8:30) Medicaid - Yes; Tricare - yes  [place] [phone] & [phone]) [facility] Mariel Aloe; [place], MD; Roger Shelter, MD; Arvilla Market, NP; [place], DO [address], [place], [place] [address] [phone] M-F 8:00 - 5:15, Sat/Sun 9-12 sick visits only Medicaid - No; Tricare - yes  [facility]  [place], PA-C; [place], PA-C; [place], DO; [place], PA-C; [place], PA-C; Roselyn Bering, MD [address] [place], [place] [address] [phone] Mon-Thur 8:00-7:00, Fri 8:00-5:00 Accepting Medicaid for 13 and under only   [facility] - Family Medicine at [place] [facility] [place] [place], [place]; List, FNP; Berneda Rose, MD; Luther Redo, PA-C; Lavonia Drafts, MD; Kellie Simmering, FNP; Genevie Cheshire, FNP; Evaristo Bury, MD; Berneda Rose, MD [phone] [address], [place], [place] [address] [phone] Mon-Fri 8:30-5:30 Medicaid - Yes; Tricare - yes  [facility]  Pediatrics - [address]  [place], [place]; Whitney Post, MD; Hennie Duos, MD; Wynne Dust, MD; [place], NP [address], [place], [place] [address] [phone] Mon-Thur 8:00-5:30, Fri 8:00-5:00, Sat 9:00-12:00 Medicaid - yes, Tricare - yes  [place] [phone])  [facility], [place]; Lenise Arena, MD; [place], [place] [address], [place], [place] [address] [phone] Mon-Fri 8:00-5:00, closed for lunch 12-1 Medicaid - No; Tricare - yes  Nature conservation officer at [facility] N Vazquez, MD [address] [place], [place] [address] [phone] Mon-Fri 8:00-5:00 Medicaid - No; Tricare - yes  [facility] - [facility], MD; Tami Ribas, MD; Mariam Dollar, MD; Yetta Barre, MD [address], [place], [place] [address] [phone] Mon-Fri 8:00-5:00 Medicaid- Yes; Tricare - yes  [place] [phone])  Adult nurse HealthCare at [facility] [place]; [place], MD [address] [place] [address] [place], [place], [place] [address] [phone] Mon-Fri 8:00-5:00 Medicaid - No; Tricare - yes  [facility] - Whitney Post - CPNP [address] [place], [place], [place] [address] [phone] Mon-Weds 8:00-6:00, Thurs-Fri 8:00-5:00, Sat 9:00-12:00 Medicaid - yes; Tricare -  yes   [facility] Katharina Caper, MD; [place], [place] [address] [place], [place] [address] [phone] Mon-Fri 8:00-5:00 Medicaid - yes; Tricare - yes  [facility] Pediatric Providers  [facility] [address] [place], [place] [address] [phone] Sheral Flow: 8am -8pm, Tues, Weds: 8am - 5pm; Fri: 8-1 Medicaid - Yes; Tricare - yes  [facility] Rachel Bo, MD; Laural Benes, MD; Anner Crete, MD; [place], [place]; [place], [place] [address], [place], [place] [address] [phone] M-F 8:30 - 5:00 Medicaid - Call office; Tricare -yes  [facility] Edson Snowball, MD; Shanon Rosser, MD, Chelsea Primus, MD; Shirlyn Goltz, PNP; Wardell Heath,  NP [phone] [address], [place], [place] [address] [phone] M-F 8:30 - 5:00, Sat/Sun 8:30 - 12:30 (sick visits) Medicaid - Call office; Tricare -yes  [facility] Melvyn Neth, MD; Karl Luke, PNP; Princess Bruins, MD; [place], [place]; [place], NP; Cynda Familia [address], [place], [place] [address] [phone] M-F 8:30 - 5:00 Medicaid - Call office; Tricare - yes  [facility] Jesusita Oka, MD; Dierdre Highman, MD; Earnest Conroy, MD; Timothy Lasso, MD; [place], MD [phone] [address], [place], [place] [address] [phone] M-Thur: 8:00 - 5:00; Fri: 8:00 - 4:00 Medicaid - yes; Tricare - yes  [facility] [address] [place], [place] [address] [phone] M-F: 8:30- 5:00, closed for lunch 12:30 - 1:00 Medicaid - yes; Tricare -yes  [facility] [address], [place], [place] [address] [phone] M-F 8:00 - 5:00 Medicaid - yes; Tricare - yes   [facility] [place], DO; Rogers, DO; [place], NP [address] [place], [place] [address] [phone] M-F 8:00 - 5:00, Closed 12-1 for lunch Medicaid - Call; Tricare - yes  [facility] Meredith Mody, MD [address] [place], [place] [address] [phone] M-F: 8:00-5:00, Sat: 8:00 - noon Medicaid - call; Tricare -yes  [facility] Pediatric Providers  [facility] [place], [place] [address] [place], [place], [place] [address] [phone] M-W: 8:00-5:00, Thur: 8:00 - 7:00, Fri: 8:00 - noon Medicaid - yes; Tricare - yes  [facility] - Quay Burow, FNP [address], [place], [place] [address] [phone] M-F 8:00 - 5:00, Closed for lunch 12-1 Medicaid - yes; Tricare - yes  [facility] Pediatric Providers  [facility] Primary Care at [place], [place], Alinda Money, MD, [place], FNP-C [address] [facility], [address], [place], [place] [address] [phone] M-T 8:00-5:00, Wed-Fri 7:00-6:00 Medicaid - Yes; Tricare -yes  [facility]  Family Medicine at [facility], DO; [address] [place], [place] [address] [phone] M-F 8:00 - 5:00, closed for lunch 12-1 Medicaid - Yes; Tricare - yes  [facility] and Internal Medicine  Zachery Dauer, MD; Gladstone Lighter, MD; Collie Siad, MD; Freda Jackson, MD; Rich Number, MD; Darryl Nestle, MD; Melinda Crutch, MD, Audria Nine, MD; Tawanna Cooler, MD; Steffanie Dunn, MD; Byrd Hesselbach, MD; Lucretia Roers, MD [address], [place], [place] [address] [phone] M-F 8:00-5:00 Medicaid - yes; Tricare - yes  [facility] [place], MD (speaks [place] and Hindi) [address] [place], [place] [address] [phone] M-F: 8:30 - 5:00, closed 12:30 - 1 for lunch Medicaid - Yes; Tricare -yes  [facility] Pediatric Providers  [facility] Shanda Bumps, MD; Chanetta Marshall, MD; Laurell Josephs, MD [address] [place], [place] [address] [phone] M-Th: 8:00 - 5:30, Fri: 8:00 - 12:00 Medicaid - yes; Tricare - yes  [facility] - Pediatrics at [facility], NP; Thora Lance, MD; Orrin Brigham, MD [phone], [place], [place] [address] [phone] M-F: 8:00 - 5:00 Medicaid - yes; Tricare - yes  [facility], NP; Orson Slick, NP; Salley Scarlet, NP; Linton Flemings, MD; Mayford Knife, MD, [place], NP, Emelda Fear, MD; Nida Boatman [address] [place], [place] [address] [phone] M-F: 8:30 - 5:30p Medicaid - yes; Tricare - yes Other locations available as well  [facility], MD; Andrey Campanile, MD; Neville Route, PA-C [address], [place], [place] [address] [phone] M-W: 8:00am - 7:00pm, Thurs: 8:00am - 8:00pm; Fri: 8:00am - 5:00pm, closed daily from 12-1 for lunch Medicaid - yes; Tricare - yes  [facility] Pediatric Providers  [facility] at Levin Erp, MD; Aggie Cosier, FNP; Bland Span, MD; Tristan Schroeder, MD; [place], PNP; Alesia Banda; [place], [place]; Julian Reil, MD;  [address] [place], [place]  [address] [phone] Judie Petit - Caleen Essex: 8am - 5pm, Sat 9-noon Medicaid - Yes; Tricare -yes  [facility] at Jaclynn Guarneri, MD; Yetta Barre, FNP; Lilian Kapur, MD; Mariam Dollar, MD [address] [place], [place] [phone] M-F 8:00 - 5:00 Medicaid - call; Tricare - yes  [facility]- Cruz Condon, MD; [place], [place]; Delora Fuel, MD; Dareen Piano, MD; Trudee Grip, MD; Kizzie Ide, MD; Zebedee Iba; Birdena Crandall, MD; Hinton Dyer, MD; [place], MD [address], [place], [place] [address] [phone] M-F 8:00am - 5:00pm; Sat. 9:00 - 11:00 Medicaid - yes; Tricare - yes  [facility] at [facility], MD [address] [place], [place] [address] [phone] M-F 8:00 - 5:00 Medicaid - [place] Medicaid only; Tricare - yes  [facility] - Illene Bolus, MD; Earlene Plater, [place]; Kenyon Ana, MD [address], [place], [place] [address] [phone] M-F 8:00 - 5:00 Medicaid - yes; Tricare - yes  [facility] [address] Pediatrics - Lind Covert, MD; Manson Passey, MD, [facility], MD, [place], MD; [place], MD; Katrinka Blazing, MD; [address] [place], [place] [address] [phone] M-F: 8-5 Medicaid - yes; Tricare - yes  [facility] - Henrietta Hoover, [place]; [place], MD; [address] [place], [place] [address] [phone] M-F 8-5 Medicaid - yes; Tricare - yes  [address] [place] Darrol Poke, MD; Tami Ribas, MD; [place], MD; [place], DNP; Daniels, PNP [address], [place], [place] [address] [phone] M-F 8-5 Medicaid - yes; Tricare - yes  [facility] Internal Medicine and Pediatrics Delories Heinz, MD; Adrienne Mocha; Ala Bent, MD [address] [place], [place] [address] [phone] M-F 7am - 5 pm Medicaid - call; Tricare - yes  [facility] [place], [place]; Fredia Beets, MD; Roxan Hockey, MD [address] [place], [place] [address] [phone] M-F  8-5 Medicaid - yes; Tricare - yes  [facility] Kae Heller, MD; Sheliah Hatch, MD; Mayford Knife, FNP; Shon Baton, FNP; Tyron Russell, FNP; Ishmael Holter; [facility] - FNP [address] [place], [place] [address] [phone] M-F 8-5 Medicaid- yes; Tricare - yes  [facility] - Betsy Coder, [place] and [place] Guest, MD; Terrial Rhodes, MD; Hulda Humphrey, MD; Roseanne Reno, MD; [place], [place]; Ala Dach, MD; Fredia Beets, MD; Dimple Casey, MD [address] [place], [place] [address] [phone] M-F: 8-5, Sat: 9-4, Sun 9-12 Medicaid - yes; Tricare - yes  [facility] [place], PNP; Earlene Plater, PNP [address] [place], [place] [address] [phone] M-F 8 - 5, closed 12-1 for lunch Medicaid - yes; Tricare - yes  [facility] Kathyrn Lass, MD; Hal Neer, MD; Dimple Casey, MD; [place], DO [address] [place], [place] [address] [phone]  M-F 8- 5:30 Medicaid - yes; Tricare - yes  [facility] - Biagio Quint, MD; Rosalia Hammers, MD; Gwenith Daily, MD [address], [place], [place] [address] [phone] Judie Petit: Nicholas Lose; Tues-Fri: 8-5; Sat: 9-12 Medicaid - yes; Tricare - yes  [facility]lena Heckle [facility] - Bobbye Morton, MD; Daphane Shepherd, MD; Chestine Spore, MD; Haskell Riling, MD; Kate Sable, MD [address] [place], [place] [address] [phone] Judie PetitNicholas LoseFrancee Nodal: 8-5; Sat: 8:30-12:30 Medicaid - yes; Tricare - yes  [facility] - Beckey Rutter, MD; [place], [place] [address] [place], [place] [address] [phone] Mon-Fri: 8-5 Medicaid - yes; Tricare - yes  Darnelle [facility] - [place], CPNP; [place], [place]; Dimple Casey, MD; Alisa Graff, MD; Cephus Shelling, MD; [address], [place], [place] [address] [phone] M-F: 8-5, closed 1-2 for lunch Medicaid - yes; Tricare - yes  Darnelle Bos  [facility] - [facility] [place], [place]; [place], [place]; Katrinka Blazing, MD; [place], CPNP; [place], [place]; [place], MD; Earlene Plater, MD [address], [place], [place] [address] [phone] M-Thurs: Nicholas Lose; Fri: 8-6; Sat: 9-12; Sun 2-4 Medicaid - yes; Tricare - yes  Darnelle Bos [facility] Georgeanna Lea, MD; Evette Cristal, MD; Shea Stakes, FNP; Earney Mallet, DO; [address] [place], [place] [address] [phone] M-F: 8-5 Medicaid - yes; Tricare - yes  [facility] Pediatric Providers  [facility] -Collene Mares, MD; [place], NP [address], [place], [place] [address] [phone] M - Fri: 8am - 5pm, closed for lunch 12-1 Medicaid - Yes; Tricare - yes  [facility] Elinor Parkinson, MD; Victory Dakin, MD; [place], DO; Vinocur, MD;Hall, PA; Clent [place], [place]; Orvan Falconer, NP [phone] [address], [place], [place] [address] [phone] M-F 8:00 - 5:00, Sat 8:00 - 11:30 Medicaid - yes; Tricare - yes  [facility] Welton Flakes, MD; [place], MD, [address], MD, [place], MD, [place], MD; [place], NP; [address], [place];  [address] [place], [place] [address] [phone] M-F 8:10am - 5:00pm Medicaid - yes; Tricare - yes  [facility] Eustaquio Boyden, MD; Step[place], NP [address], [place], [place] [address] [phone] M-F 8:00 - 5:00 Medicaid - [place] Medicaid only; Tricare - yes  [facility] - [place] [address] [place], MD; [place], NP [address], [place], [place] [address] [phone] M-F 8:00 - 5:00; Closed for lunch 12 - 1:00 Medicaid - yes; Tricare - yes  [facility] Belva Crome, MD; Jonita Albee, FNP [address] [place], [place] [address] [phone] Mon 9-5; Tues/Wed 10-5; Thurs 8:30-5; Fri: 8-12:30 Medicaid - yes; Tricare - yes  [facility] Pediatric Providers  [facility]   Harriman, MD; [place], [place] [address], [place], [place] [address] [phone] phone [phone] fax M-F 7:15 - 4:30 Medicaid - yes; Tricare - yes  [employer] [facility] Karilyn Cota, MD; [place], DO [address] [place], [place] [address] [phone] M-Fri: 8:30 - 5:00, closed for lunch everyday noon - 1pm Medicaid - Yes; Tricare - yes  [facility] Burdine, MD; Reuel Boom, MD; Dimas Aguas, MD; Neita Carp, MD; [place], [place]; Bonnita Nasuti, [place]; [place], [place]; [place], [place]; [place], [place] [address] [place], [place] [address] [phone] M-Thurs: 7:30am - 7:00pm; Friday 7:30am - 4pm; Sat: 8:00 - 1:00 Medicaid - Yes; Tricare - yes  [facility] [place],  MD; Conni Elliot, MD; Carroll Kinds, MD; Mort Sawyers, DO [address], [place], [place] [address] [phone] M-Thur: 8:00 - 5:00, Fri: 8:00 - Noon Medicaid - yes; Tricare - yes No [place] [employer]  [place] [facility] Dettinger, MD; Nadine Counts, DO; [place], NP; Daphine Deutscher, NP; Lequita Halt, NP; Ellamae Sia, NP; Reginia Forts, NP; Darlyn Read, MD; [place], [place] [address] [place], [place] [address] [phone] M-F 8:00 - 5:00 Medicaid - yes; Tricare - yes  [facility] [place] [facility], FNP-C; [place], FNP-C [address] [place], [place] [address] [phone] M, W, R 8:00-5:00, Tues: 8:00am - 7:00pm; Fri 8:00 - noon Medicaid - Yes; Tricare - yes  [facility], MD [address] [place], [place] [address] [phone]  M-Thurs 8:30-5:30, Fri: 8:30-12:30pm Medicaid - Yes; Tricare - N

## 2023-09-22 DIAGNOSIS — Z3A10 10 weeks gestation of pregnancy: Secondary | ICD-10-CM | POA: Diagnosis not present

## 2023-09-22 DIAGNOSIS — Z3491 Encounter for supervision of normal pregnancy, unspecified, first trimester: Secondary | ICD-10-CM | POA: Diagnosis not present

## 2023-09-22 LAB — CBC/D/PLT+RPR+RH+ABO+RUBIGG...
Antibody Screen: NEGATIVE
Basophils Absolute: 0 10*3/uL (ref 0.0–0.2)
Basos: 1 %
EOS (ABSOLUTE): 0.1 10*3/uL (ref 0.0–0.4)
Eos: 2 %
HCV Ab: NONREACTIVE
HIV Screen 4th Generation wRfx: NONREACTIVE
Hematocrit: 42.4 % (ref 34.0–46.6)
Hemoglobin: 13.8 g/dL (ref 11.1–15.9)
Hepatitis B Surface Ag: NEGATIVE
Immature Grans (Abs): 0 10*3/uL (ref 0.0–0.1)
Immature Granulocytes: 0 %
Lymphocytes Absolute: 1.6 10*3/uL (ref 0.7–3.1)
Lymphs: 43 %
MCH: 27.7 pg (ref 26.6–33.0)
MCHC: 32.5 g/dL (ref 31.5–35.7)
MCV: 85 fL (ref 79–97)
Monocytes Absolute: 0.2 10*3/uL (ref 0.1–0.9)
Monocytes: 6 %
Neutrophils Absolute: 1.8 10*3/uL (ref 1.4–7.0)
Neutrophils: 48 %
Platelets: 323 10*3/uL (ref 150–450)
RBC: 4.98 x10E6/uL (ref 3.77–5.28)
RDW: 13.2 % (ref 11.7–15.4)
RPR Ser Ql: NONREACTIVE
Rh Factor: POSITIVE
Rubella Antibodies, IGG: 3.34 {index} (ref >0.99)
WBC: 3.7 10*3/uL (ref 3.4–10.8)

## 2023-09-22 LAB — HCV INTERPRETATION

## 2023-09-22 LAB — GC/CHLAMYDIA PROBE AMP (~~LOC~~) NOT AT ARMC
Chlamydia: NEGATIVE
Comment: NEGATIVE
Comment: NORMAL
Neisseria Gonorrhea: NEGATIVE

## 2023-09-24 ENCOUNTER — Ambulatory Visit (HOSPITAL_COMMUNITY)
Admission: RE | Admit: 2023-09-24 | Discharge: 2023-09-24 | Disposition: A | Discharge status: Home / Self Care | Payer: 59 | Source: Ambulatory Visit | Attending: Family Medicine | Admitting: Family Medicine

## 2023-09-24 DIAGNOSIS — Z3A10 10 weeks gestation of pregnancy: Secondary | ICD-10-CM | POA: Diagnosis not present

## 2023-09-24 DIAGNOSIS — O208 Other hemorrhage in early pregnancy: Secondary | ICD-10-CM | POA: Diagnosis not present

## 2023-09-24 DIAGNOSIS — O3680X0 Pregnancy with inconclusive fetal viability, not applicable or unspecified: Secondary | ICD-10-CM | POA: Insufficient documentation

## 2023-09-24 DIAGNOSIS — Z3687 Encounter for antenatal screening for uncertain dates: Secondary | ICD-10-CM | POA: Diagnosis not present

## 2023-09-24 DIAGNOSIS — Z3682 Encounter for antenatal screening for nuchal translucency: Secondary | ICD-10-CM | POA: Diagnosis not present

## 2023-09-25 LAB — URINE CULTURE, OB REFLEX

## 2023-09-25 LAB — CULTURE, OB URINE

## 2023-09-27 ENCOUNTER — Other Ambulatory Visit: Payer: Self-pay

## 2023-09-27 ENCOUNTER — Other Ambulatory Visit: Payer: Self-pay | Admitting: Obstetrics and Gynecology

## 2023-09-27 DIAGNOSIS — O0990 Supervision of high risk pregnancy, unspecified, unspecified trimester: Secondary | ICD-10-CM

## 2023-09-27 DIAGNOSIS — O99891 Other specified diseases and conditions complicating pregnancy: Secondary | ICD-10-CM | POA: Insufficient documentation

## 2023-09-27 DIAGNOSIS — R7309 Other abnormal glucose: Secondary | ICD-10-CM

## 2023-09-27 MED ORDER — AMOXICILLIN 500 MG PO CAPS: 500.0000 mg | ORAL_CAPSULE | Freq: Three times a day (TID) | ORAL | 0 refills | Status: AC

## 2023-09-28 ENCOUNTER — Telehealth: Payer: Self-pay | Admitting: Obstetrics and Gynecology

## 2023-09-28 ENCOUNTER — Other Ambulatory Visit: Payer: Self-pay

## 2023-09-28 ENCOUNTER — Other Ambulatory Visit (HOSPITAL_COMMUNITY)
Admission: RE | Admit: 2023-09-28 | Discharge: 2023-09-28 | Disposition: A | Discharge status: Home / Self Care | Payer: 59 | Source: Ambulatory Visit | Attending: Obstetrics and Gynecology | Admitting: Obstetrics and Gynecology

## 2023-09-28 ENCOUNTER — Ambulatory Visit: Payer: 59 | Admitting: Obstetrics and Gynecology

## 2023-09-28 ENCOUNTER — Encounter: Payer: Self-pay | Admitting: Family Medicine

## 2023-09-28 ENCOUNTER — Other Ambulatory Visit: Payer: 59

## 2023-09-28 ENCOUNTER — Encounter: Payer: Self-pay | Admitting: Obstetrics and Gynecology

## 2023-09-28 VITALS — BP 119/85 | HR 85 | Wt 318.3 lb

## 2023-09-28 DIAGNOSIS — R7309 Other abnormal glucose: Secondary | ICD-10-CM

## 2023-09-28 DIAGNOSIS — R8271 Bacteriuria: Secondary | ICD-10-CM | POA: Diagnosis not present

## 2023-09-28 DIAGNOSIS — O0991 Supervision of high risk pregnancy, unspecified, first trimester: Secondary | ICD-10-CM

## 2023-09-28 DIAGNOSIS — Z8659 Personal history of other mental and behavioral disorders: Secondary | ICD-10-CM

## 2023-09-28 DIAGNOSIS — Z124 Encounter for screening for malignant neoplasm of cervix: Secondary | ICD-10-CM | POA: Insufficient documentation

## 2023-09-28 DIAGNOSIS — Z1332 Encounter for screening for maternal depression: Secondary | ICD-10-CM

## 2023-09-28 DIAGNOSIS — O0990 Supervision of high risk pregnancy, unspecified, unspecified trimester: Secondary | ICD-10-CM

## 2023-09-28 DIAGNOSIS — Z3A11 11 weeks gestation of pregnancy: Secondary | ICD-10-CM | POA: Diagnosis not present

## 2023-09-28 DIAGNOSIS — O99891 Other specified diseases and conditions complicating pregnancy: Secondary | ICD-10-CM | POA: Diagnosis not present

## 2023-09-28 NOTE — Telephone Encounter (Signed)
Telephone call  

## 2023-09-28 NOTE — Progress Notes (Signed)
INITIAL PRENATAL VISIT  Subjective:   Brandi Manning is being seen today for her first obstetrical visit. She is at [redacted]w[redacted]d gestation by LMP Her obstetrical history is significant for  none . Relationship with FOB: significant other, living together. Patient does intend to breast feed. Pregnancy history fully reviewed.  Patient reports no complaints.  Indications for ASA therapy (per uptodate) One of the following: Previous pregnancy with preeclampsia, especially early onset and with an adverse outcome No Multifetal gestation No Chronic hypertension No Type 1 or 2 diabetes mellitus No Chronic kidney disease No Autoimmune disease (antiphospholipid syndrome, systemic lupus erythematosus) No  Two or more of the following: Nulliparity Yes Obesity (body mass index >30 kg/m2) Yes Family history of preeclampsia in mother or sister No Age >=35 years No Sociodemographic characteristics (African American race, low socioeconomic level) Yes Personal risk factors (eg, previous pregnancy with low birth weight or small for gestational age infant, previous adverse pregnancy outcome [eg, stillbirth], interval >10 years between pregnancies) No  Objective:    Obstetric History OB History  Gravida Para Term Preterm AB Living  1       SAB IAB Ectopic Multiple Live Births          # Outcome Date GA Lbr Len/2nd Weight Sex Type Anes PTL Lv  1 Current             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   Dysuria 07/29/2016   Encounter for contraceptive management 01/08/2017   Encounter for health maintenance examination in adult 01/16/2016   Goiter 11/16/2014   Headache    intermittent   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   Hypertriglyceridemia 06/28/2020   Moderate episode of recurrent major depressive disorder (HCC) 01/15/2023   Morbid obesity (HCC)  11/16/2014   Morning headache 03/19/2017   Relationship problem with parent 04/17/2016   Screening for cervical cancer 01/16/2016   Skin lesion 01/08/2017   Snoring 03/19/2017   Vaginal irritation 07/29/2016   Wears glasses     Past Surgical History:  Procedure Laterality Date   WISDOM TOOTH EXTRACTION      Current Outpatient Medications on File Prior to Visit  Medication Sig Dispense Refill   amoxicillin (AMOXIL) 500 MG capsule Take 1 capsule (500 mg total) by mouth 3 (three) times daily for 5 days. 15 capsule 0   Prenatal Vit-Fe Fumarate-FA (MULTIVITAMIN-PRENATAL) 27-0.8 MG TABS tablet Take 1 tablet by mouth daily at 12 noon.     No current facility-administered medications on file prior to visit.    No Known Allergies  Social History:  reports that she has never smoked. She has never used smokeless tobacco. She reports that she does not currently use alcohol. She reports that she does not use drugs.  Family History  Problem Relation Age of Onset   Hypertension Mother    Arthritis Mother    Heart disease Mother        ?   Diabetes Maternal Grandmother    Heart disease Paternal Grandfather     The following portions of the patient's history were reviewed and updated as appropriate: allergies, current medications, past family history, past medical history, past social history, past surgical history and problem list.  Review of Systems Review of Systems  All other systems reviewed and are negative.     Physical Exam:  BP 119/85  Pulse 85   Wt (!) 318 lb 5 oz (144.4 kg)   LMP 07/12/2023   BMI 48.40 kg/m  CONSTITUTIONAL: Well-developed, well-nourished female in no acute distress.  HENT:  Normocephalic, atraumatic.   EYES: Conjunctivae normal.  NECK: Normal range of motion SKIN: Skin is warm and dry.  MUSCULOSKELETAL: Normal range of motion.  NEUROLOGIC: Alert and oriented   PSYCHIATRIC: Normal mood and affect.  CARDIOVASCULAR: Normal heart rate  noted RESPIRATORY: normal effort ABDOMEN: Soft, PELVIC: Normal appearing external genitalia; normal appearing vaginal mucosa and cervix.  No abnormal discharge noted.  Pap smear obtained.        Movement: Absent       Assessment:    Pregnancy: G1P0  1. Supervision of high risk pregnancy, antepartum (Primary) BP normal FHT obtained with bedside ultrasound  2. Elevated hemoglobin A1c Early GTT today  3. Asymptomatic bacteriuria during pregnancy Started abx yesterday, will do TOC next visit  4. [redacted] weeks gestation of pregnancy Childbirth class information given as well as waterbirth, undecided on waterbirth   5. Cervical cancer screening  - Cytology - PAP  6. History of depression PHQ elevated today, offered counseling, meds, both At this time would like to wait and see how she is at consecutive visits. Has a counselor she sees once a month. Denies SI, encouraged to follow up if needed before next visit      Plan:     Initial labs drawn. Prenatal vitamins. Problem list reviewed and updated. Reviewed in detail the nature of the practice with collaborative care between  Genetic screening discussed: NIPS/First trimester screen/Quad/AFP  pending . Role of ultrasound in pregnancy discussed; Anatomy US: ordered.  Follow up in 4 weeks. Discussed clinic routines, schedule of care and testing, genetic screening options, involvement of students and residents under the direct supervision of APPs and doctors and presence of female providers. Pt verbalized understanding.  Return in 4 weeks for [facility] ob visit Future Appointments  Date Time Provider Department Center  10/26/2023 10:55 AM Venora Maples, MD [facility]  11/29/2023  9:15 AM [facility] NURSE [facility]  11/29/2023  9:30 AM [facility] US2 [facility]      Sue Lush, FNP

## 2023-09-28 NOTE — Patient Instructions (Signed)
We highly recommend childbirth education to help you plan for labor and begin practicing coping skills (which will be needed with or without pain meds).  [place] Childbirth Education Options: Sign up by visiting [email or web address]  Childbirth ~ Self-Paced eClass (English and Spanish) This online class offers you the freedom to complete a childbirth education series in the comfort of your own home at your own pace.  Childbirth Class (In-Person 4-Week Series  or on Saturdays, Virtual 4-Week Series ~ [place]) This interactive in-person class series will help you and your partner prepare for your birth experience. Topics include: Labor & Birth, Comfort Measures, Breathing Techniques, Massage, Medical Interventions, Pain Management Options, Cesarean Birth, Postpartum Care, and Newborn Care  Comfort Techniques for Labor ~ In-Person Class ([place]) This interactive class is designed for parents-to-be who want to learn & practice hands-on skills to help relieve some of the discomfort of labor and encourage their babies to rotate toward the best position for birth. Moms and their partners will be able to try a variety of labor positions with birth balls and rebozos as well as practice breathing, relaxation, and visualization techniques.  Natural Childbirth Class (In-Person 5-Week Series, In-Person on Saturdays or Virtual 5-Week Series ~ [place]) This class series is designed for expectant parents who want to learn and practice natural methods of coping with the process of labor and childbirth.  Cesarean Birth Self-Paced eClass (English and Spanish) This online course provides comprehensive information you can trust as you prepare for a possible cesarean birth. In this class, you'll learn how to make your birth and recovery comfortable and joyful through instructive video clips, animations, and activities.  Waterbirth ~ Virtual Class Interested in a waterbirth? In addition to a consultation  with your credentialed waterbirth provider, this free, informational online class will help you discover whether waterbirth is the right fit for you. Not all obstetrical practices offer waterbirth, so check with your healthcare provider.  Tour (Self-Paced Video) - [facility] [place] Watch our 4 minute video tour of [place] [facility] located in [place].   [place] Parenting Education Options:  Pregnancy 101 (Virtual) Congratulations on your pregnancy! This class is geared toward moms in their first trimester, but everyone is welcome. We are excited to guide you through all aspects of supporting a healthy pregnancy. You will learn what to expect at routine prenatal care appointments, common postpartum adjustments, basic infant safety, and breastfeeding.  Successful Partnering & Parenting ~ In-Person Workshop ([place]) This workshop inspires and equips partners of all economic levels, ages, and cultures to confidently care for their infants, support the birthing persons, and navigate their own transformations into new partners and parents. Learning activities are geared towards supporting partner, but moms are welcome to attend.  'Baby & Me' Parenting Group (Virtual on Wednesdays at 11am) Enjoy this time discussing newborn & infant parenting topics and family adjustment issues with other new parents in a relaxed environment. Each week brings a new speaker or baby-centered activity. This group offers support and connection to parents as they journey through the adjustments and struggles of that sometimes overwhelming first year after the birth of a child.  Baby Safety, CPR, & Choking Class ~ Virtual This life-saving information is meant to encourage parents as they learn important safety and prevention tips as well as infant CPR and relief of choking.  Breastfeeding Class (In-Person in [place] or Virtual) Families learn what to expect in the  first days and weeks of breastfeeding your   newborn. IF YOU ARE AN EMPLOYEE TAKING THIS CLASS FOR CREDIT, DO NOT register yourself. Please e-mail [email or web address].   Breastfeeding Self-Paced eClass (English & Spanish) Families learn what to expect in the first days and weeks of breastfeeding your newborn.  Caring for Baby ~ In-Person, Virtual or Self-Paced Class This in-person class is for both expectant and adoptive parents who want to learn and practice the most up-to-date newborn care for their babies. Focus is on birth through the first six weeks of life.  CPR & Choking Relief for Infants & Children ~ In-Person Class ([place]) This in-person course is designed for any parent, expectant parent, or adult who cares for infants or children. Participants learn and demonstrate cardiopulmonary resuscitation and choking relief procedures for both infants and children.  Grandparent Love ~ In-Person Class Grandparents will learn the most updated infant care and safety recommendations. They will discover ways to support their own children during the transition into the parenting role and receive tips on communicating with the new parents.  [place] Parenting Support Group Options:  Bereavement Grief Support Group (Pregnancy/Infant Loss) - Virtual This is an ongoing experience that meets once a month and is designed to help you honor the past, assist you in discovering tools to strengthen you today, and aid you in developing hope for the future.  Breastfeeding & Pumping Support Group (In-Person on Thursdays at 12pm or Virtual on Tuesdays at 5pm) Join us in-person each Thursday starting June 1st, 2023 at 12pm! This support group is free for all families looking for breastfeeding and/or pumping support.   Community-Based Childbirth Education Options:  [facility] Classes:  Childbirth education classes can help you get ready for a positive parenting experience. You  can also meet other expectant parents and get free stuff for your baby. Each class runs for five weeks on the same night and costs $45 for the mother-to-be and her support person. Medicaid covers the cost if you are eligible. Call [phone] to register.  [employer] [employer] offers a variety of programs for the [place] community and is another great way to get connected. Please go to [email or web address] for more information.  Childbirth With A Twist! Be informed of your options, get educated on birth, understand what your body is doing, learn how to cope, and have a lot of fun and laughs all while doing it either from the comfort of your couch OR in our cozy office and classroom space near the [place] airport. If you are taking a virtual class, then class is taught LIVE, so you can ask questions and receive answers in real-time from an experienced doula and childbirth educator.  This virtual childbirth education class will meet for five instruction times online.  Although we are based in , [place], this virtual class is open to anyone in the world. Please visit: [email or web address] for more information.  Books We Love: The Doula Guide to Childbirth by Ananda Lowe and Rachel Zimmerman The First-Time Parent's Childbirth Handbook by Dr. Stephanie [place], CNM The Birth Partner by Penny Simkin    Considering Waterbirth? Guide for patients at [facility]) Why consider waterbirth? Gentle birth for babies  Less pain medicine used in labor  May allow for passive descent/less pushing  May reduce perineal tears  More mobility and instinctive maternal position changes  Increased maternal relaxation   Is waterbirth safe? What are the risks of infection, drowning or other complications? Infection:  Very low risk (3.7 % for tub vs   4.8% for bed)  7 in 8000 waterbirths with documented infection  Poorly cleaned equipment most  common cause  Slightly lower group B strep transmission rate  Drowning  Maternal:  Very low risk  Related to seizures or fainting  Newborn:  Very low risk. No evidence of increased risk of respiratory problems in multiple large studies  Physiological protection from breathing under water  Avoid underwater birth if there are any fetal complications  Once baby's head is out of the water, keep it out.  Birth complication  Some reports of cord trauma, but risk decreased by bringing baby to surface gradually  No evidence of increased risk of shoulder dystocia. Mothers can usually change positions faster in water than in a bed, possibly aiding the maneuvers to free the shoulder.   There are 2 things you MUST do to have a waterbirth with [facility]: Attend a waterbirth class at [facility] [place]   3rd Wednesday of every month from 7-9 pm (virtual during COVID) Free Register online at [email or web address] or [email or web address] or by calling [phone] Bring us the certificate from the class to your prenatal appointment or send via MyChart Meet with a midwife at 36 weeks* to see if you can still plan a waterbirth and to sign the consent.   *We also recommend that you schedule as many of your prenatal visits with a midwife as possible.    Helpful information: You may want to bring a bathing suit top to the hospital to wear during labor but this is optional.  All other supplies are provided by the hospital. Please arrive at the hospital with signs of active labor, and do not wait at home until late in labor. It takes 45 min- 1 hour for fetal monitoring, and check in to your room to take place, plus transport and filling of the waterbirth tub.    Things that would prevent you from having a waterbirth: Premature, <37wks  Previous cesarean birth  Presence of thick meconium-stained fluid  Multiple gestation (Twins, triplets, etc.)  Uncontrolled diabetes or gestational  diabetes requiring medication  Hypertension diagnosed in pregnancy or preexisting hypertension (gestational hypertension, preeclampsia, or chronic hypertension) Fetal growth restriction (your baby measures less than 10th percentile on ultrasound) Heavy vaginal bleeding  Non-reassuring fetal heart rate  Active infection (MRSA, etc.). Group B Strep is NOT a contraindication for waterbirth.  If your labor has to be induced and induction method requires continuous monitoring of the baby's heart rate  Other risks/issues identified by your obstetrical provider   Please remember that birth is unpredictable. Under certain unforeseeable circumstances your provider may advise against giving birth in the tub. These decisions will be made on a case-by-case basis and with the safety of you and your baby as our highest priority.    Updated 12/10/21   

## 2023-09-29 LAB — GLUCOSE TOLERANCE, 2 HOURS W/ 1HR
Glucose, 1 hour: 92 mg/dL (ref 70–179)
Glucose, 2 hour: 97 mg/dL (ref 70–152)
Glucose, Fasting: 81 mg/dL (ref 70–91)

## 2023-09-30 LAB — HORIZON CUSTOM: REPORT SUMMARY: NEGATIVE

## 2023-09-30 LAB — PANORAMA PRENATAL TEST FULL PANEL:PANORAMA TEST PLUS 5 ADDITIONAL MICRODELETIONS: FETAL FRACTION: 3.5

## 2023-09-30 LAB — CYTOLOGY - PAP: Diagnosis: NEGATIVE

## 2023-09-30 MED ORDER — ASPIRIN 81 MG PO TBEC: 81.0000 mg | DELAYED_RELEASE_TABLET | Freq: Every day | ORAL | 2 refills | Status: DC

## 2023-10-22 NOTE — Telephone Encounter (Signed)
Please advise

## 2023-10-26 ENCOUNTER — Encounter: Payer: Self-pay | Admitting: Family Medicine

## 2023-10-26 ENCOUNTER — Other Ambulatory Visit: Payer: Self-pay

## 2023-10-26 ENCOUNTER — Ambulatory Visit: Payer: 59 | Admitting: Family Medicine

## 2023-10-26 VITALS — BP 122/83 | HR 92 | Wt 311.0 lb

## 2023-10-26 DIAGNOSIS — O99891 Other specified diseases and conditions complicating pregnancy: Secondary | ICD-10-CM

## 2023-10-26 DIAGNOSIS — O0992 Supervision of high risk pregnancy, unspecified, second trimester: Secondary | ICD-10-CM

## 2023-10-26 DIAGNOSIS — R8271 Bacteriuria: Secondary | ICD-10-CM | POA: Diagnosis not present

## 2023-10-26 DIAGNOSIS — O0990 Supervision of high risk pregnancy, unspecified, unspecified trimester: Secondary | ICD-10-CM | POA: Diagnosis not present

## 2023-10-26 DIAGNOSIS — Z3A15 15 weeks gestation of pregnancy: Secondary | ICD-10-CM | POA: Diagnosis not present

## 2023-10-26 MED ORDER — PROMETHAZINE HCL 25 MG PO TABS: 25.0000 mg | ORAL_TABLET | Freq: Four times a day (QID) | ORAL | 0 refills | Status: DC | PRN

## 2023-10-26 MED ORDER — FAMOTIDINE 20 MG PO TABS: 20.0000 mg | ORAL_TABLET | Freq: Two times a day (BID) | ORAL | 0 refills | Status: DC

## 2023-10-26 NOTE — Progress Notes (Signed)
   Subjective:  Brandi Manning is a 28 y.o. G1P0 at [redacted]w[redacted]d being seen today for ongoing prenatal care.  She is currently monitored for the following issues for this low-risk pregnancy and has Supervision of high risk pregnancy, antepartum and Asymptomatic bacteriuria during pregnancy on their problem list.  Patient reports no complaints.  Contractions: Not present. Vag. Bleeding: None.  Movement: Present. Denies leaking of fluid.   The following portions of the patient's history were reviewed and updated as appropriate: allergies, current medications, past family history, past medical history, past social history, past surgical history and problem list. Problem list updated.  Objective:   Vitals:   10/26/23 1114  BP: 122/83  Pulse: 92  Weight: (!) 311 lb (141.1 kg)    Fetal Status:     Movement: Present     General:  Alert, oriented and cooperative. Patient is in no acute distress.  Skin: Skin is warm and dry. No rash noted.   Cardiovascular: Normal heart rate noted  Respiratory: Normal respiratory effort, no problems with respiration noted  Abdomen: Soft, gravid, appropriate for gestational age. Pain/Pressure: Absent     Pelvic: Vag. Bleeding: None     Cervical exam deferred        Extremities: Normal range of motion.  Edema: Trace  Mental Status: Normal mood and affect. Normal behavior. Normal judgment and thought content.   Urinalysis:      Assessment and Plan:  Pregnancy: G1P0 at [redacted]w[redacted]d  1. Supervision of high risk pregnancy, antepartum (Primary) BP normal FHR not able to be obtained with doppler, but on bedside US normal fetal cardiac activity and somatic movement appreciated and shown to patient AFP discussed, accepts lab draw today  2. Asymptomatic bacteriuria during pregnancy Rx sent after last visit, patient reports she did take treatment TOC collected today  Preterm labor symptoms and general obstetric precautions including but not limited to vaginal bleeding,  contractions, leaking of fluid and fetal movement were reviewed in detail with the patient. Please refer to After Visit Summary for other counseling recommendations.  No follow-ups on file.   Venora Maples, MD

## 2023-10-26 NOTE — Patient Instructions (Signed)

## 2023-10-28 ENCOUNTER — Encounter: Payer: Self-pay | Admitting: Family Medicine

## 2023-10-28 LAB — URINE CULTURE, OB REFLEX

## 2023-10-28 LAB — CULTURE, OB URINE

## 2023-10-29 ENCOUNTER — Encounter: Payer: Self-pay | Admitting: Family Medicine

## 2023-10-29 LAB — AFP, SERUM, OPEN SPINA BIFIDA
AFP MoM: 1.16
AFP Value: 24.5 ng/mL
Gest. Age on Collection Date: 15 wk
Maternal Age At EDD: 29.3 a
OSBR Risk 1 IN: 10000
Test Results:: NEGATIVE
Weight: 311 [lb_av]

## 2023-11-05 DIAGNOSIS — Z0289 Encounter for other administrative examinations: Secondary | ICD-10-CM

## 2023-11-08 MED ORDER — ONDANSETRON 4 MG PO TBDP: 4.0000 mg | ORAL_TABLET | Freq: Three times a day (TID) | ORAL | 0 refills | Status: DC | PRN

## 2023-11-19 ENCOUNTER — Other Ambulatory Visit: Payer: Self-pay | Admitting: Obstetrics and Gynecology

## 2023-11-19 ENCOUNTER — Encounter: Payer: Self-pay | Admitting: Family Medicine

## 2023-11-19 DIAGNOSIS — O99210 Obesity complicating pregnancy, unspecified trimester: Secondary | ICD-10-CM | POA: Insufficient documentation

## 2023-11-29 ENCOUNTER — Ambulatory Visit: Attending: Obstetrics and Gynecology | Admitting: Obstetrics and Gynecology

## 2023-11-29 ENCOUNTER — Encounter: Payer: Self-pay | Admitting: *Deleted

## 2023-11-29 ENCOUNTER — Other Ambulatory Visit: Payer: Self-pay | Admitting: *Deleted

## 2023-11-29 ENCOUNTER — Ambulatory Visit: Payer: 59 | Attending: Obstetrics and Gynecology

## 2023-11-29 ENCOUNTER — Ambulatory Visit (INDEPENDENT_AMBULATORY_CARE_PROVIDER_SITE_OTHER): Payer: 59 | Admitting: Family Medicine

## 2023-11-29 ENCOUNTER — Ambulatory Visit: Payer: 59 | Admitting: *Deleted

## 2023-11-29 VITALS — BP 109/74 | HR 85 | Wt 316.2 lb

## 2023-11-29 VITALS — BP 140/62 | HR 80

## 2023-11-29 DIAGNOSIS — Z3A20 20 weeks gestation of pregnancy: Secondary | ICD-10-CM | POA: Diagnosis not present

## 2023-11-29 DIAGNOSIS — O321XX0 Maternal care for breech presentation, not applicable or unspecified: Secondary | ICD-10-CM | POA: Insufficient documentation

## 2023-11-29 DIAGNOSIS — O99212 Obesity complicating pregnancy, second trimester: Secondary | ICD-10-CM | POA: Diagnosis not present

## 2023-11-29 DIAGNOSIS — O3412 Maternal care for benign tumor of corpus uteri, second trimester: Secondary | ICD-10-CM | POA: Diagnosis not present

## 2023-11-29 DIAGNOSIS — O0990 Supervision of high risk pregnancy, unspecified, unspecified trimester: Secondary | ICD-10-CM | POA: Diagnosis not present

## 2023-11-29 DIAGNOSIS — O99210 Obesity complicating pregnancy, unspecified trimester: Secondary | ICD-10-CM

## 2023-11-29 DIAGNOSIS — O358XX0 Maternal care for other (suspected) fetal abnormality and damage, not applicable or unspecified: Secondary | ICD-10-CM | POA: Diagnosis not present

## 2023-11-29 DIAGNOSIS — Z3491 Encounter for supervision of normal pregnancy, unspecified, first trimester: Secondary | ICD-10-CM | POA: Diagnosis not present

## 2023-11-29 DIAGNOSIS — O99891 Other specified diseases and conditions complicating pregnancy: Secondary | ICD-10-CM

## 2023-11-29 DIAGNOSIS — E6689 Other obesity not elsewhere classified: Secondary | ICD-10-CM

## 2023-11-29 DIAGNOSIS — D259 Leiomyoma of uterus, unspecified: Secondary | ICD-10-CM | POA: Diagnosis not present

## 2023-11-29 DIAGNOSIS — R8271 Bacteriuria: Secondary | ICD-10-CM

## 2023-11-29 DIAGNOSIS — Z363 Encounter for antenatal screening for malformations: Secondary | ICD-10-CM | POA: Insufficient documentation

## 2023-11-29 DIAGNOSIS — D251 Intramural leiomyoma of uterus: Secondary | ICD-10-CM | POA: Diagnosis not present

## 2023-11-29 NOTE — Progress Notes (Signed)
 Maternal-Fetal Medicine Consultation I had the pleasure of seeing [name] [facility] at [facility]. She is G1 P0 at 20-weeks' gestation and is here for fetal anatomy scan.  Patient had increased hemoglobin A1c (5.7%).  Early screening with 3-hour GTT ruled out gestational diabetes.  Pregravid BMI of 48.  Patient was advised to take low-dose aspirin and she has not yet started taking aspirin.  On cell-free fetal Linea screening, the risks of fetal aneuploidies are not increased.  MSAFP screening showed low risk for open neural tube defects.  Ultrasound We performed a fetal anatomical survey.  Amniotic fluid is normal good fetal activity seen.  Fetal biometry is consistent with the previously established dates.  No markers of aneuploidies or obvious fetal structural defects are seen. 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 Patient understands the limitations of ultrasound in detecting fetal anomalies.  As maternal obesity imposes limitations on the resolution of images, fetal anomalies may be missed.  Uterine myoma -Myomas can undergo degenerations and cause severe abdominal pain that is managed conservatively with analgesics. Degeneration with symptoms commonly occur in the second trimester. Patient does not have abdominal pain. -Myomas can also lead to preterm delivery, growth restriction or placental abruption. -Malpresentations, cesarean deliveries and postpaturm hemorrhages are more frequent.  -Myomas tend to regress after delivery.   Pregravid BMI 48 Maternal obesity increases slightly the risk of stillbirth (2.5-to 3-fold) but the absolute risk is very small.  I discussed the importance of weekly antenatal testing from [redacted] weeks gestation till delivery.  I encouraged the patient to take low-dose aspirin prophylaxis.  Aspirin delays or prevents preeclampsia.  Recommendations -An appointment was made for her to return in 4 weeks for completion of fetal  anatomy. -Weekly BPP from [redacted] weeks gestation till delivery.  Consultation including face-to-face (more than 50%) counseling 30 minutes.

## 2023-11-29 NOTE — Progress Notes (Signed)
   PRENATAL VISIT NOTE  Subjective:  Brandi Manning is a 28 y.o. G1P0 at [redacted]w[redacted]d being seen today for ongoing prenatal care.  She is currently monitored for the following issues for this high-risk pregnancy and has Supervision of high risk pregnancy, antepartum; Asymptomatic bacteriuria during pregnancy; and Obesity affecting pregnancy, antepartum on their problem list.  Patient reports no bleeding, no contractions, no cramping, and no leaking.  Contractions: Not present. Vag. Bleeding: None.  Movement: Present. Denies leaking of fluid.   The following portions of the patient's history were reviewed and updated as appropriate: allergies, current medications, past family history, past medical history, past social history, past surgical history and problem list.   Objective:   Vitals:   11/29/23 1045  BP: 109/74  Pulse: 85  Weight: (!) 316 lb 3.2 oz (143.4 kg)    Fetal Status: Fetal Heart Rate (bpm): 131   Movement: Present     General:  Alert, oriented and cooperative. Patient is in no acute distress.  Skin: Skin is warm and dry. No rash noted.   Cardiovascular: Normal heart rate noted  Respiratory: Normal respiratory effort, no problems with respiration noted  Abdomen: Soft, gravid, appropriate for gestational age.  Pain/Pressure: Present     Pelvic: Cervical exam deferred        Extremities: Normal range of motion.  Edema: Trace  Mental Status: Normal mood and affect. Normal behavior. Normal judgment and thought content.   Assessment and Plan:  Pregnancy: G1P0 at [redacted]w[redacted]d 1. Supervision of high risk pregnancy, antepartum (Primary) FHR and BP appropriate today Continue routine prenatal care  2. Morbid obesity (HCC) Anatomy scan today Following with MFM, repeat scan in 4 weeks  3. Asymptomatic bacteriuria during pregnancy TOC negative at last check  4. [redacted] weeks gestation of pregnancy   Preterm labor symptoms and general obstetric precautions including but not limited to vaginal  bleeding, contractions, leaking of fluid and fetal movement were reviewed in detail with the patient. Please refer to After Visit Summary for other counseling recommendations.   No follow-ups on file.  Future Appointments  Date Time Provider Department Center  12/28/2023 10:35 AM Venora Maples, MD [facility]  12/28/2023  1:00 PM [facility] PROVIDER 1 [facility]  12/28/2023  1:30 PM [facility] US4 [facility]    Celedonio Savage, MD

## 2023-12-20 ENCOUNTER — Encounter (HOSPITAL_COMMUNITY): Payer: Self-pay

## 2023-12-20 ENCOUNTER — Ambulatory Visit (HOSPITAL_COMMUNITY)
Admission: EM | Admit: 2023-12-20 | Discharge: 2023-12-20 | Disposition: A | Discharge status: Home / Self Care | Attending: Family Medicine | Admitting: Family Medicine

## 2023-12-20 ENCOUNTER — Encounter: Payer: Self-pay | Admitting: Family Medicine

## 2023-12-20 DIAGNOSIS — J069 Acute upper respiratory infection, unspecified: Secondary | ICD-10-CM | POA: Diagnosis not present

## 2023-12-20 LAB — POC SARS CORONAVIRUS 2 AG -  ED: SARS Coronavirus 2 Ag: NEGATIVE

## 2023-12-20 NOTE — ED Triage Notes (Signed)
 Pt reports she has a runny nose, sore throat, facial pain, having hot flashes and SOB x 3 days

## 2023-12-20 NOTE — Discharge Instructions (Signed)
 The test for COVID was negative.  And most likely has some other viral infection.  You can continue to use the Flonase no spray 2 sprays each nostril once daily in case that ends up helping some of the swelling around her sinus openings.  You could also use over-the-counter nasal decongestant spray such as Neo-Synephrine or Afrin.  It is recommended that you do not use it more than about 3 days in a row.  It should be safe in pregnancy but it is best to limit use to possibly once a day.  Maybe try using it before you try to lie down to go to sleep.  It is also safe to take pseudoephedrine tablets by mouth for congestion.  Your blood pressure is normal here.  If your blood pressure has been elevated at some of your prenatal visits in the been told you have elevated blood pressure, and please do not take the oral tablets.   Make sure you are drinking enough fluids

## 2023-12-20 NOTE — ED Provider Notes (Signed)
 [facility]    CSN: [id number] Arrival date & time: 12/20/23  1318      History   Chief Complaint No chief complaint on file.   HPI Brandi Manning is a 28 y.o. female.   HPI Here for nasal congestion and sore throat and cough. Also having sinus pressure and burning in her sinuses.  No fever or chills.  She has felt short of breath in her chest also.  Symptoms began in April 11 and worsened yesterday.  No vomiting or new diarrhea.  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   Hypertriglyceridemia 06/28/2020   Moderate episode of recurrent major depressive disorder (HCC) 01/15/2023   Morbid obesity (HCC) 11/16/2014   Morning headache 03/19/2017   Relationship problem with parent 04/17/2016   Screening for cervical cancer 01/16/2016   Skin lesion 01/08/2017   Snoring 03/19/2017   Vaginal irritation 07/29/2016   Wears glasses     Patient Active Problem List   Diagnosis Date Noted   Obesity affecting pregnancy, antepartum 11/19/2023   Asymptomatic bacteriuria during pregnancy 09/27/2023   Supervision of high risk pregnancy, antepartum 09/21/2023    Past Surgical History:  Procedure Laterality Date   WISDOM TOOTH EXTRACTION      OB History     Gravida  1   Para      Term      Preterm      AB      Living         SAB      IAB      Ectopic      Multiple      Live Births               Home Medications    Prior to Admission medications   Medication Sig Start Date End Date Taking? Authorizing Provider  aspirin EC 81 MG tablet Take 1  tablet (81 mg total) by mouth daily. Start taking when you are [redacted] weeks pregnant for rest of pregnancy for prevention of preeclampsia 09/30/23   Zelma Hidden, FNP  famotidine (PEPCID) 20 MG tablet TAKE 1 TABLET BY MOUTH TWICE A DAY Patient not taking: Reported on 11/29/2023 11/19/23   Zelma Hidden, FNP  ondansetron (ZOFRAN-ODT) 4 MG disintegrating tablet Take 1 tablet (4 mg total) by mouth every 8 (eight) hours as needed for nausea or vomiting. Patient not taking: Reported on 11/29/2023 11/08/23   Cresenzo, John V, MD  Prenatal Vit-Fe Fumarate-FA (MULTIVITAMIN-PRENATAL) 27-0.8 MG TABS tablet Take 1 tablet by mouth daily at 12 noon.    [provider]  promethazine (PHENERGAN) 25 MG tablet Take 1 tablet (25 mg total) by mouth every 6 (six) hours as needed for nausea or vomiting. Patient not taking: Reported on 11/29/2023 10/26/23   Zelma Hidden, FNP    Family History Family History  Problem Relation Age of Onset   Hypertension Mother    Arthritis Mother    Heart disease Mother        ?   Diabetes Maternal Grandmother    Heart disease  Paternal Grandfather     Social History Social History   Tobacco Use   Smoking status: Never   Smokeless tobacco: Never  Vaping Use   Vaping status: Never Used  Substance Use Topics   Alcohol use: Not Currently    Comment: occ   Drug use: No     Allergies   Patient has no known allergies.   Review of Systems Review of Systems   Physical Exam Triage Vital Signs ED Triage Vitals  Encounter Vitals Group     BP 12/20/23 1333 132/82     Systolic BP Percentile --      Diastolic BP Percentile --      Pulse Rate 12/20/23 1333 (!) 103     Resp 12/20/23 1333 20     Temp 12/20/23 1333 98 F (36.7 C)     Temp Source 12/20/23 1333 Oral     SpO2 12/20/23 1333 97 %     Weight --      Height --      Head Circumference --      Peak Flow --      Pain Score 12/20/23 1334 10     Pain Loc --      Pain Education --      Exclude  from Growth Chart --    No data found.  Updated Vital Signs BP 132/82 (BP Location: Left Arm)   Pulse (!) 103   Temp 98 F (36.7 C) (Oral)   Resp 20   LMP 07/12/2023   SpO2 97%   Visual Acuity Right Eye Distance:   Left Eye Distance:   Bilateral Distance:    Right Eye Near:   Left Eye Near:    Bilateral Near:     Physical Exam Vitals reviewed.  Constitutional:      General: She is not in acute distress.    Appearance: She is not ill-appearing, toxic-appearing or diaphoretic.  HENT:     Right Ear: Tympanic membrane and ear canal normal.     Left Ear: Tympanic membrane and ear canal normal.     Nose: Congestion present.     Mouth/Throat:     Mouth: Mucous membranes are moist.     Comments: There is some erythema of the posterior oropharynx and clear mucus is draining Eyes:     Extraocular Movements: Extraocular movements intact.     Conjunctiva/sclera: Conjunctivae normal.     Pupils: Pupils are equal, round, and reactive to light.  Cardiovascular:     Rate and Rhythm: Normal rate and regular rhythm.     Heart sounds: No murmur heard. Pulmonary:     Effort: Pulmonary effort is normal. No respiratory distress.     Breath sounds: No stridor. No wheezing, rhonchi or rales.  Musculoskeletal:     Cervical back: Neck supple.  Lymphadenopathy:     Cervical: No cervical adenopathy.  Skin:    Capillary Refill: Capillary refill takes less than 2 seconds.     Coloration: Skin is not jaundiced or pale.  Neurological:     General: No focal deficit present.     Mental Status: She is alert and oriented to person, place, and time.  Psychiatric:        Behavior: Behavior normal.      UC Treatments / Results  Labs (all labs ordered are listed, but only abnormal results are displayed) Labs Reviewed  POC SARS CORONAVIRUS 2 AG -  ED    EKG   Radiology No  results found.  Procedures Procedures (including critical care time)  Medications Ordered in UC Medications -  No data to display  Initial Impression / Assessment and Plan / UC Course  I have reviewed the triage vital signs and the nursing notes.  Pertinent labs & imaging results that were available during my care of the patient were reviewed by me and considered in my medical decision making (see chart for details).     COVID swab is negative.  I have recommended nasal decongestant with limited use for her to help with congestion   Final Clinical Impressions(s) / UC Diagnoses   Final diagnoses:  Viral URI     Discharge Instructions      The test for COVID was negative.  And most likely has some other viral infection.  You can continue to use the Flonase no spray 2 sprays each nostril once daily in case that ends up helping some of the swelling around her sinus openings.  You could also use over-the-counter nasal decongestant spray such as Neo-Synephrine or Afrin.  It is recommended that you do not use it more than about 3 days in a row.  It should be safe in pregnancy but it is best to limit use to possibly once a day.  Maybe try using it before you try to lie down to go to sleep.  It is also safe to take pseudoephedrine tablets by mouth for congestion.  Your blood pressure is normal here.  If your blood pressure has been elevated at some of your prenatal visits in the been told you have elevated blood pressure, and please do not take the oral tablets.   Make sure you are drinking enough fluids     ED Prescriptions   None    PDMP not reviewed this encounter.   Ann Keto, MD 12/20/23 [phone]

## 2023-12-28 ENCOUNTER — Encounter: Payer: Self-pay | Admitting: Family Medicine

## 2023-12-28 ENCOUNTER — Other Ambulatory Visit: Payer: Self-pay

## 2023-12-28 ENCOUNTER — Other Ambulatory Visit: Payer: Self-pay | Admitting: *Deleted

## 2023-12-28 ENCOUNTER — Ambulatory Visit: Attending: Obstetrics and Gynecology | Admitting: Obstetrics and Gynecology

## 2023-12-28 ENCOUNTER — Ambulatory Visit (HOSPITAL_BASED_OUTPATIENT_CLINIC_OR_DEPARTMENT_OTHER)

## 2023-12-28 ENCOUNTER — Ambulatory Visit: Payer: 59 | Admitting: Family Medicine

## 2023-12-28 VITALS — BP 136/82 | HR 97

## 2023-12-28 VITALS — BP 127/66 | HR 63

## 2023-12-28 DIAGNOSIS — O26892 Other specified pregnancy related conditions, second trimester: Secondary | ICD-10-CM

## 2023-12-28 DIAGNOSIS — O0990 Supervision of high risk pregnancy, unspecified, unspecified trimester: Secondary | ICD-10-CM

## 2023-12-28 DIAGNOSIS — O99212 Obesity complicating pregnancy, second trimester: Secondary | ICD-10-CM

## 2023-12-28 DIAGNOSIS — Z3A24 24 weeks gestation of pregnancy: Secondary | ICD-10-CM | POA: Diagnosis not present

## 2023-12-28 DIAGNOSIS — O99210 Obesity complicating pregnancy, unspecified trimester: Secondary | ICD-10-CM

## 2023-12-28 DIAGNOSIS — R519 Headache, unspecified: Secondary | ICD-10-CM

## 2023-12-28 DIAGNOSIS — O99810 Abnormal glucose complicating pregnancy: Secondary | ICD-10-CM

## 2023-12-28 DIAGNOSIS — O99891 Other specified diseases and conditions complicating pregnancy: Secondary | ICD-10-CM

## 2023-12-28 DIAGNOSIS — E669 Obesity, unspecified: Secondary | ICD-10-CM

## 2023-12-28 DIAGNOSIS — Z362 Encounter for other antenatal screening follow-up: Secondary | ICD-10-CM

## 2023-12-28 DIAGNOSIS — O358XX0 Maternal care for other (suspected) fetal abnormality and damage, not applicable or unspecified: Secondary | ICD-10-CM | POA: Diagnosis not present

## 2023-12-28 DIAGNOSIS — O0992 Supervision of high risk pregnancy, unspecified, second trimester: Secondary | ICD-10-CM

## 2023-12-28 DIAGNOSIS — R8271 Bacteriuria: Secondary | ICD-10-CM

## 2023-12-28 DIAGNOSIS — J302 Other seasonal allergic rhinitis: Secondary | ICD-10-CM

## 2023-12-28 MED ORDER — FLUTICASONE PROPIONATE 50 MCG/ACT NA SUSP: 2.0000 | Freq: Two times a day (BID) | NASAL | 3 refills | Status: DC

## 2023-12-28 MED ORDER — CYCLOBENZAPRINE HCL 10 MG PO TABS: 10.0000 mg | ORAL_TABLET | Freq: Three times a day (TID) | ORAL | 1 refills | Status: DC | PRN

## 2023-12-28 MED ORDER — LORATADINE 10 MG PO TABS: 10.0000 mg | ORAL_TABLET | Freq: Every day | ORAL | 3 refills | Status: DC

## 2023-12-28 NOTE — Progress Notes (Signed)
   Subjective:  Brandi Manning is a 28 y.o. G1P0 at [redacted]w[redacted]d being seen today for ongoing prenatal care.  She is currently monitored for the following issues for this low-risk pregnancy and has Supervision of high risk pregnancy, antepartum; Asymptomatic bacteriuria during pregnancy; and Obesity affecting pregnancy, antepartum on their problem list.  Patient reports no complaints.  Contractions: Not present.  .  Movement: Present. Denies leaking of fluid.   The following portions of the patient's history were reviewed and updated as appropriate: allergies, current medications, past family history, past medical history, past social history, past surgical history and problem list. Problem list updated.  Objective:   Vitals:   12/28/23 1053  BP: 136/82  Pulse: 97    Fetal Status: Fetal Heart Rate (bpm): 146   Movement: Present     General:  Alert, oriented and cooperative. Patient is in no acute distress.  Skin: Skin is warm and dry. No rash noted.   Cardiovascular: Normal heart rate noted  Respiratory: Normal respiratory effort, no problems with respiration noted  Abdomen: Soft, gravid, appropriate for gestational age. Pain/Pressure: Present     Pelvic:       Cervical exam deferred        Extremities: Normal range of motion.  Edema: None  Mental Status: Normal mood and affect. Normal behavior. Normal judgment and thought content.   Urinalysis:      Assessment and Plan:  Pregnancy: G1P0 at [redacted]w[redacted]d  1. Supervision of high risk pregnancy, antepartum (Primary) BP and FHR normal Discussed fasting labs for next visit Reports ongoing issues with her sinuses, has been doing flonase  once daily but still having lots of pressure, pain, and headaches Discussed nasal washes with sterile water followed by flonase  BID along with starting claritin , if no improvement after one week we can reassess  Also trial flexeril  for headaches  2. Obesity affecting pregnancy, antepartum, unspecified obesity  type   3. Asymptomatic bacteriuria during pregnancy TOC neg at last visit  Preterm labor symptoms and general obstetric precautions including but not limited to vaginal bleeding, contractions, leaking of fluid and fetal movement were reviewed in detail with the patient. Please refer to After Visit Summary for other counseling recommendations.  Return in 4 weeks (on 01/25/2024) for Dyad patient, ob visit.   Teena Feast, MD

## 2023-12-28 NOTE — Patient Instructions (Signed)

## 2023-12-28 NOTE — Progress Notes (Signed)
 After review, MFM consult with provider is not indicated for today  Cassandria Clever, MD 12/28/2023 5:40 PM  [facility]

## 2024-01-19 ENCOUNTER — Other Ambulatory Visit: Payer: Self-pay

## 2024-01-19 DIAGNOSIS — O0990 Supervision of high risk pregnancy, unspecified, unspecified trimester: Secondary | ICD-10-CM

## 2024-01-26 ENCOUNTER — Ambulatory Visit

## 2024-01-26 ENCOUNTER — Encounter: Admitting: Family Medicine

## 2024-01-27 ENCOUNTER — Ambulatory Visit: Admitting: Family Medicine

## 2024-01-27 ENCOUNTER — Other Ambulatory Visit: Payer: Self-pay

## 2024-01-27 ENCOUNTER — Encounter: Payer: Self-pay | Admitting: Family Medicine

## 2024-01-27 VITALS — BP 116/82 | HR 102 | Wt 317.3 lb

## 2024-01-27 DIAGNOSIS — Z3A28 28 weeks gestation of pregnancy: Secondary | ICD-10-CM | POA: Diagnosis not present

## 2024-01-27 DIAGNOSIS — O0990 Supervision of high risk pregnancy, unspecified, unspecified trimester: Secondary | ICD-10-CM | POA: Diagnosis not present

## 2024-01-27 DIAGNOSIS — O99213 Obesity complicating pregnancy, third trimester: Secondary | ICD-10-CM | POA: Diagnosis not present

## 2024-01-27 DIAGNOSIS — E669 Obesity, unspecified: Secondary | ICD-10-CM

## 2024-01-27 DIAGNOSIS — O09293 Supervision of pregnancy with other poor reproductive or obstetric history, third trimester: Secondary | ICD-10-CM

## 2024-01-27 DIAGNOSIS — O99210 Obesity complicating pregnancy, unspecified trimester: Secondary | ICD-10-CM

## 2024-01-27 NOTE — Progress Notes (Signed)
   Subjective:  Brandi Manning is a 28 y.o. G1P0 at [redacted]w[redacted]d being seen today for ongoing prenatal care.  She is currently monitored for the following issues for this low-risk pregnancy and has Supervision of high risk pregnancy, antepartum and Obesity affecting pregnancy, antepartum on their problem list.  Patient reports no complaints.  Contractions: Not present. Vag. Bleeding: None.  Movement: Present. Denies leaking of fluid.   The following portions of the patient's history were reviewed and updated as appropriate: allergies, current medications, past family history, past medical history, past social history, past surgical history and problem list. Problem list updated.  Objective:   Vitals:   01/27/24 0840  BP: 116/82  Pulse: (!) 102  Weight: (!) 317 lb 4.8 oz (143.9 kg)    Fetal Status: Fetal Heart Rate (bpm): 136   Movement: Present     General:  Alert, oriented and cooperative. Patient is in no acute distress.  Skin: Skin is warm and dry. No rash noted.   Cardiovascular: Normal heart rate noted  Respiratory: Normal respiratory effort, no problems with respiration noted  Abdomen: Soft, gravid, appropriate for gestational age. Pain/Pressure: Present     Pelvic: Vag. Bleeding: None     Cervical exam deferred        Extremities: Normal range of motion.  Edema: Trace  Mental Status: Normal mood and affect. Normal behavior. Normal judgment and thought content.   Urinalysis:      Assessment and Plan:  Pregnancy: G1P0 at [redacted]w[redacted]d  1. Supervision of high risk pregnancy, antepartum (Primary) BP and FHR normal Third trimester labs today Offered TDAP, discussed rationale, she is undecided today  2. Obesity affecting pregnancy, antepartum, unspecified obesity type   Preterm labor symptoms and general obstetric precautions including but not limited to vaginal bleeding, contractions, leaking of fluid and fetal movement were reviewed in detail with the patient. Please refer to After  Visit Summary for other counseling recommendations.  Return in 2 weeks (on 02/10/2024) for Dyad patient, ob visit.   Teena Feast, MD

## 2024-01-27 NOTE — Patient Instructions (Signed)

## 2024-01-28 ENCOUNTER — Ambulatory Visit: Payer: Self-pay | Admitting: Family Medicine

## 2024-01-28 DIAGNOSIS — O0990 Supervision of high risk pregnancy, unspecified, unspecified trimester: Secondary | ICD-10-CM

## 2024-01-28 LAB — CBC
Hematocrit: 38.3 % (ref 34.0–46.6)
Hemoglobin: 12.5 g/dL (ref 11.1–15.9)
MCH: 28.8 pg (ref 26.6–33.0)
MCHC: 32.6 g/dL (ref 31.5–35.7)
MCV: 88 fL (ref 79–97)
Platelets: 311 10*3/uL (ref 150–450)
RBC: 4.34 x10E6/uL (ref 3.77–5.28)
RDW: 13.5 % (ref 11.7–15.4)
WBC: 6.8 10*3/uL (ref 3.4–10.8)

## 2024-01-28 LAB — GLUCOSE TOLERANCE, 2 HOURS W/ 1HR
Glucose, 1 hour: 106 mg/dL (ref 70–179)
Glucose, 2 hour: 94 mg/dL (ref 70–152)
Glucose, Fasting: 77 mg/dL (ref 70–91)

## 2024-01-28 LAB — HIV ANTIBODY (ROUTINE TESTING W REFLEX): HIV Screen 4th Generation wRfx: NONREACTIVE

## 2024-01-28 LAB — RPR: RPR Ser Ql: NONREACTIVE

## 2024-02-02 ENCOUNTER — Ambulatory Visit: Admitting: Obstetrics

## 2024-02-02 ENCOUNTER — Other Ambulatory Visit: Payer: Self-pay | Admitting: *Deleted

## 2024-02-02 ENCOUNTER — Ambulatory Visit: Attending: Obstetrics and Gynecology

## 2024-02-02 VITALS — BP 140/77 | HR 92

## 2024-02-02 DIAGNOSIS — Z3A29 29 weeks gestation of pregnancy: Secondary | ICD-10-CM | POA: Diagnosis not present

## 2024-02-02 DIAGNOSIS — O0990 Supervision of high risk pregnancy, unspecified, unspecified trimester: Secondary | ICD-10-CM

## 2024-02-02 DIAGNOSIS — O99210 Obesity complicating pregnancy, unspecified trimester: Secondary | ICD-10-CM

## 2024-02-02 DIAGNOSIS — O99810 Abnormal glucose complicating pregnancy: Secondary | ICD-10-CM

## 2024-02-02 DIAGNOSIS — E669 Obesity, unspecified: Secondary | ICD-10-CM

## 2024-02-02 DIAGNOSIS — Z362 Encounter for other antenatal screening follow-up: Secondary | ICD-10-CM | POA: Insufficient documentation

## 2024-02-02 DIAGNOSIS — O99213 Obesity complicating pregnancy, third trimester: Secondary | ICD-10-CM | POA: Insufficient documentation

## 2024-02-02 DIAGNOSIS — O99212 Obesity complicating pregnancy, second trimester: Secondary | ICD-10-CM | POA: Diagnosis not present

## 2024-02-02 NOTE — Progress Notes (Signed)
 MFM Consult Note  Brandi Manning is currently at 29 weeks and 2 days.  She has been followed due to maternal obesity with a BMI of 48.3.    She denies any problems since her last exam and has screened negative for gestational diabetes in her current pregnancy.  On today's exam, the overall EFW of 3 pounds measures at the 34th percentile for her gestational age.    There was normal amniotic fluid noted with a total AFI of 20.2 cm.  Due to maternal obesity, we will start weekly fetal testing at 34 weeks.    She will return in 5 weeks for a growth scan and BPP.    The patient stated that all of her questions were answered today.  A total of 10 minutes was spent counseling and coordinating the care for this patient.  Greater than 50% of the time was spent in direct face-to-face contact.

## 2024-02-16 ENCOUNTER — Ambulatory Visit (INDEPENDENT_AMBULATORY_CARE_PROVIDER_SITE_OTHER): Payer: Self-pay | Admitting: Clinical

## 2024-02-16 ENCOUNTER — Other Ambulatory Visit: Payer: Self-pay

## 2024-02-16 ENCOUNTER — Ambulatory Visit (INDEPENDENT_AMBULATORY_CARE_PROVIDER_SITE_OTHER): Admitting: Family Medicine

## 2024-02-16 VITALS — BP 124/82 | HR 120 | Wt 319.7 lb

## 2024-02-16 DIAGNOSIS — F419 Anxiety disorder, unspecified: Secondary | ICD-10-CM

## 2024-02-16 DIAGNOSIS — Z3A31 31 weeks gestation of pregnancy: Secondary | ICD-10-CM

## 2024-02-16 DIAGNOSIS — Z23 Encounter for immunization: Secondary | ICD-10-CM

## 2024-02-16 DIAGNOSIS — F32A Depression, unspecified: Secondary | ICD-10-CM | POA: Diagnosis not present

## 2024-02-16 DIAGNOSIS — O99210 Obesity complicating pregnancy, unspecified trimester: Secondary | ICD-10-CM | POA: Diagnosis not present

## 2024-02-16 DIAGNOSIS — O0990 Supervision of high risk pregnancy, unspecified, unspecified trimester: Secondary | ICD-10-CM | POA: Diagnosis not present

## 2024-02-16 DIAGNOSIS — F332 Major depressive disorder, recurrent severe without psychotic features: Secondary | ICD-10-CM | POA: Diagnosis not present

## 2024-02-16 DIAGNOSIS — F411 Generalized anxiety disorder: Secondary | ICD-10-CM

## 2024-02-16 DIAGNOSIS — Z658 Other specified problems related to psychosocial circumstances: Secondary | ICD-10-CM

## 2024-02-16 NOTE — BH Specialist Note (Signed)
 Integrated Behavioral Health Initial In-Person Visit  MRN: [id number] Name: Brandi Manning  Number of Integrated Behavioral Health Clinician visits: 1- Initial Visit  Session Start time: 1115    Session End time: 1144  Total time in minutes: 29    Types of Service: Individual psychotherapy  Interpretor:No. Interpretor Name and Language: n/a   Subjective: Brandi Manning is a 28 y.o. female accompanied by n/a Patient was referred by Princess Brooks, MD for depression/anxiety. Patient reports the following symptoms/concerns: Increased anxiety and depression, last treated with  Zoloft and Prozac  (stopped about two months later with no change) and meditation strategies.  Pt attributes passive SI thoughts to life stress; copes best now by sleeping; open to referral to psychiatry and to implement self-coping strategy today.  Duration of problem: Over one year; Severity of problem: severe  Objective: Mood: Anxious and Depressed and Affect: Depressed and Tearful Risk of harm to self or others: Suicidal ideation No plan to harm self or others  Life Context: Family and Social: Pt lives with husband and parents School/Work: Working full-time (will have at least 6 weeks paid maternity leave) Self-Care: - Life Changes: Married less than one year; current first pregnancy  Patient and/or Family's Strengths/Protective Factors: Social connections and Sense of purpose  Goals Addressed: Patient will: Reduce symptoms of: anxiety, depression, and stress Increase knowledge and/or ability of: healthy habits and self-management skills  Demonstrate ability to: Increase healthy adjustment to current life circumstances, Increase adequate support systems for patient/family, and Increase motivation to adhere to plan of care  Progress towards Goals: Ongoing  Interventions: Interventions utilized: CBT Cognitive Behavioral Therapy, Functional Assessment of ADLs, Psychoeducation and/or Health Education,  Link to [employer], and Supportive Reflection  Standardized Assessments completed: GAD-7 and PHQ 9     Patient and/or Family Response: Patient agrees with treatment plan.   Patient Centered Plan: Patient is on the following Treatment Plan(s):  IBH  Clinical Assessment/Diagnosis  Severe episode of recurrent major depressive disorder, without psychotic features (HCC)  Generalized anxiety disorder  Psychosocial stressors .   Patient may benefit from psychoeducation and brief therapeutic interventions regarding coping with symptoms of depression, anxiety, life stress .  Plan: Follow up with behavioral health clinician on : Two weeks; Call Carylon Tamburro at [phone], as needed. Behavioral recommendations:  -Continue taking prenatal vitamin daily as prescribed -Accept referral to psychiatry -Continue prioritizing healthy self-care (regular meals, adequate rest; allowing practical help from supportive friends and family) ; continue using meditation strategies and journal as needed, for as long as remains helpful -Begin Worry Time strategy, as discussed. Start by setting up start and end time reminders on phone today; continue daily for two weeks. -Consider new mom support group as needed at either [email or web address] or [email or web address]  -Use [facility] walk-in, as needed and discussed   Referral(s): Integrated Art gallery manager (In Clinic), Community Mental Health Services (LME/Outside Clinic), and Community Resources:  New parent support  [place] Ki[place], [place]     02/16/2024   10:48 AM 09/28/2023   11:00 AM 01/15/2023   10:37 AM 01/15/2023    9:21 AM 06/28/2020   10:42 AM  Depression screen PHQ 2/9  Decreased Interest 2 2 2 2 2   Down, Depressed, Hopeless 2 2 2 2 2   PHQ - 2 Score 4 4 4 4 4   Altered sleeping 3 3 3  2   Tired, decreased energy 3 3 2  2   Change in appetite 2 3 3  3   Feeling bad or failure about yourself  2 2 2  2   Trouble concentrating 2 3 2  2    Moving slowly or fidgety/restless 2 2   2   Suicidal thoughts 2 2 2  2   PHQ-9 Score 20 22 18  19   Difficult doing work/chores   Somewhat difficult  Somewhat difficult      02/16/2024   10:48 AM 09/28/2023   11:00 AM 01/15/2023   10:37 AM 06/28/2020   10:43 AM  GAD 7 : Generalized Anxiety Score  Nervous, Anxious, on Edge 3 3 2 2   Control/stop worrying 3 3 3 2   Worry too much - different things 3 3 2 2   Trouble relaxing 2 3 3 1   Restless 2 3 3 1   Easily annoyed or irritable 2 3 3 1   Afraid - awful might happen 3 3 3 1   Total GAD 7 Score 18 21 19 10   Anxiety Difficulty   Somewhat difficult Somewhat difficult

## 2024-02-16 NOTE — Patient Instructions (Signed)
 Center for [facility] at [facility] [address] [place], [place] [address] [phone] (main office) [phone] [facility] office)  New Parent Support Groups www.postpartum.net www.conehealthybaby.com     BRAINSTORMING  Develop a Plan Goals: Provide a way to start conversation about your new life with a baby Assist parents in recognizing and using resources within their reach Help pave the way before birth for an easier period of transition afterwards.  Make a list of the following information to keep in a central location: Full name of Mom and Partner: _____________________________________________ Baby's full name and Date of Birth: ___________________________________________ Home Address: ___________________________________________________________ ________________________________________________________________________ Home Phone: ____________________________________________________________ Parents' cell numbers: _____________________________________________________ ________________________________________________________________________ Name and contact info for OB: ______________________________________________ Name and contact info for Pediatrician:________________________________________ Contact info for Lactation Consultants: ________________________________________  REST and SLEEP *You each need at least 4-5 hours of uninterrupted sleep every day. Write specific names and contact information.* How are you going to rest in the postpartum period? While partner's home? When partner returns to work? When you both return to work? Where will your baby sleep? Who is available to help during the day? Evening? Night? Who could move in for a period to help support you? What are some ideas to help you get enough  sleep? __________________________________________________________________________________________________________________________________________________________________________________________________________________________________________ NUTRITIOUS FOOD AND DRINK *Plan for meals before your baby is born so you can have healthy food to eat during the immediate postpartum period.* Who will look after breakfast? Lunch? Dinner? List names and contact information. Brainstorm quick, healthy ideas for each meal. What can you do before baby is born to prepare meals for the postpartum period? How can others help you with meals? Which grocery stores provide online shopping and delivery? Which restaurants offer take-out or delivery options? ______________________________________________________________________________________________________________________________________________________________________________________________________________________________________________________________________________________________________________________________________________________________________________________________________  CARE FOR MOM *It's important that mom is cared for and pampered in the postpartum period. Remember, the most important ways new mothers need care are: sleep, nutrition, gentle exercise, and time off.* Who can come take care of mom during this period? Make a list of people with their contact information. List some activities that make you feel cared for, rested, and energized? Who can make sure you have opportunities to do these things? Does mom have a space of her very own within your home that's just for her? Make a "[facility]" where she can be comfortable, rest, and renew herself  daily. ______________________________________________________________________________________________________________________________________________________________________________________________________________________________________________________________________________________________________________________________________________________________________________________________________    CARE FOR AND FEEDING BABY *Knowledgeable and encouraging people will offer the best support with regard to feeding your baby.* Educate yourself and choose the best feeding option for your baby. Make a list of people who will guide, support, and be a resource for you as your care for and feed your baby. (Friends that have breastfed or are currently breastfeeding, lactation consultants, breastfeeding support groups, etc.) Consider a postpartum doula. (These websites can give you information: dona.org & https://shea.org/) Seek out local breastfeeding resources like the breastfeeding support group at [employer] or [employer]. ______________________________________________________________________________________________________________________________________________________________________________________________________________________________________________________________________________________________________________________________________________________________________________________________________  Blondell Burgess AND ERRANDS Who can help with a thorough cleaning before baby is born? Make a list of people who will help with housekeeping and chores, like laundry, light cleaning, dishes, bathrooms, etc. Who can run some errands for you? What can you do to make sure you are stocked with basic supplies before baby is born? Who is going to do the  shopping? ______________________________________________________________________________________________________________________________________________________________________________________________________________________________________________________________________________________________________________________________________________________________________________________________________     Family Adjustment *Nurture yourselves.it helps parents be more loving and allows for better bonding with their child.* What sorts  of things do you and partner enjoy doing together? Which activities help you to connect and strengthen your relationship? Make a list of those things. Make a list of people whom you trust to care for your baby so you can have some time together as a couple. What types of things help partner feel connected to Mom? Make a list. What needs will partner have in order to bond with baby? Other children? Who will care for them when you go into labor and while you are in the hospital? Think about what the needs of your older children might be. Who can help you meet those needs? In what ways are you helping them prepare for bringing baby home? List some specific strategies you have for family adjustment. _______________________________________________________________________________________________________________________________________________________________________________________________________________________________________________________________________________________________________________________________________________  SUPPORT *Someone who can empathize with experiences normalizes your problems and makes them more bearable.* Make a list of other friends, neighbors, and/or co-workers you know with infants (and small children, if applicable) with whom you can connect. Make a list of local or online support groups, mom groups, etc. in which you can be  involved. ______________________________________________________________________________________________________________________________________________________________________________________________________________________________________________________________________________________________________________________________________________________________________________________________________  Childcare Plans Investigate and plan for childcare if mom is returning to work. Talk about mom's concerns about her transition back to work. Talk about partner's concerns regarding this transition.  Mental Health *Your mental health is one of the highest priorities for a pregnant or postpartum mom.* 1 in 5 women experience anxiety and/or depression from the time of conception through the first year after birth. Postpartum Mood Disorders are the #1 complication of pregnancy and childbirth and the suffering experienced by these mothers is not necessary! These illnesses are temporary and respond well to treatment, which often includes self-care, social support, talk therapy, and medication when needed. Women experiencing anxiety and depression often say things like: "I'm supposed to be happy.why do I feel so sad?", "Why can't I snap out of it?", "I'm having thoughts that scare me." There is no need to be embarrassed if you are feeling these symptoms: Overwhelmed, anxious, angry, sad, guilty, irritable, hopeless, exhausted but can't sleep You are NOT alone. You are NOT to blame. With help, you WILL be well. Where can I find help? Medical professionals such as your OB, midwife, gynecologist, family practitioner, primary care provider, pediatrician, or mental health providers; [facility] support groups: Feelings After Birth, Breastfeeding Support Group, Baby and Me Group, and Fit 4 Two exercise classes. You have permission to ask for help. It will confirm your feelings, validate your experiences,  share/learn coping strategies, and gain support and encouragement as you heal. You are important! BRAINSTORM Make a list of local resources, including resources for mom and for partner. Identify support groups. Identify people to call late at night - include names and contact info. Talk with partner about perinatal mood and anxiety disorders. Talk with your OB, midwife, and doula about baby blues and about perinatal mood and anxiety disorders. Talk with your pediatrician about perinatal mood and anxiety disorders.   Support & Sanity Savers   What do you really need?  Basics In preparing for a new baby, many expectant parents spend hours shopping for baby clothes, decorating the nursery, and deciding which car seat to buy. Yet most don't think much about what the reality of parenting a newborn will be like, and what they need to make it through that. So, here is the advice of experienced parents. We know you'll read this, and think "they're exaggerating, I don't really need that." Just trust us   on these, OK? Plan for all of this, and if it turns out you don't need it, come back and teach us  how you did it!  Must-Haves (Once baby's survival needs are met, make sure you attend to your own survival needs!) Sleep An average newborn sleeps 16-18 hours per day, over 6-7 sleep periods, rarely more than three hours at a time. It is normal and healthy for a newborn to wake throughout the night... but really hard on parents!! Naps. Prioritize sleep above any responsibilities like: cleaning house, visiting friends, running errands, etc.  Sleep whenever baby sleeps. If you can't nap, at least have restful times when baby eats. The more rest you get, the more patient you will be, the more emotionally stable, and better at solving problems.  Food You may not have realized it would be difficult to eat when you have a newborn. Yet, when we talk to countless new parents, they say things like "it may be 2:00 pm  when I realize I haven't had breakfast yet." Or "every time we sit down to dinner, baby needs to eat, and my food gets cold, so I don't bother to eat it." Finger food. Before your baby is born, stock up with one months' worth of food that: 1) you can eat with one hand while holding a baby, 2) doesn't need to be prepped, 3) is good hot or cold, 4) doesn't spoil when left out for a few hours, and 5) you like to eat. Think about: nuts, dried fruit, Clif bars, pretzels, jerky, gogurt, baby carrots, apples, bananas, crackers, cheez-n-crackers, string cheese, hot pockets or frozen burritos to microwave, garden burgers and breakfast pastries to put in the toaster, yogurt drinks, etc. Restaurant Menus. Make lists of your favorite restaurants & menu items. When family/friends want to help, you can give specific information without much thought. They can either bring you the food or send gift cards for just the right meals. Freezer Meals.  Take some time to make a few meals to put in the freezer ahead of time.  Easy to freeze meals can be anything such as soup, lasagna, chicken pie, or spaghetti sauce. Set up a Meal Schedule.  Ask friends and family to sign up to bring you meals during the first few weeks of being home. (It can be passed around at baby showers!) You have no idea how helpful this will be until you are in the throes of parenting.  MachineLive.it is a great website to check out. Emotional Support Know who to call when you're stressed out. Parenting a newborn is very challenging work. There are times when it totally overwhelms your normal coping abilities. EVERY NEW PARENT NEEDS TO HAVE A PLAN FOR WHO TO CALL WHEN THEY JUST CAN'T COPE ANY MORE. (And it has to be someone other than the baby's other parent!) Before your baby is born, come up with at least one person you can call for support - write their phone number down and post it on the refrigerator. Anxiety & Sadness. Baby blues are normal after  pregnancy; however, there are more severe types of anxiety & sadness which can occur and should not be ignored.  They are always treatable, but you have to take the first step by reaching out for help. [facility] offers a "Mom Talk" group which meets every Tuesday from 10 am - 11 am.  This group is for new moms who need support and connection after their babies are born.  Call [phone].  Really, Really Helpful (Plan for them! Make sure these happen often!!) Physical Support with Taking Care of Yourselves Asking friends and family. Before your baby is born, set up a schedule of people who can come and visit and help out (or ask a friend to schedule for you). Any time someone says "let me know what I can do to help," sign them up for a day. When they get there, their job is not to take care of the baby (that's your job and your joy). Their job is to take care of you!  Postpartum doulas. If you don't have anyone you can call on for support, look into postpartum doulas:  professionals at helping parents with caring for baby, caring for themselves, getting breastfeeding started, and helping with household tasks. www.padanc.org is a helpful website for learning about doulas in our area. Peer Support / Parent Groups Why: One of the greatest ideas for new parents is to be around other new parents. Parent groups give you a chance to share and listen to others who are going through the same season of life, get a sense of what is normal infant development by watching several babies learn and grow, share your stories of triumph and struggles with empathetic ears, and forgive your own mistakes when you realize all parents are learning by trial and error. Where to find: There are many places you can meet other new parents throughout our community.  [facility] offers the following classes for new moms and their little ones:  Baby and Me (Birth to Crawling) and Breastfeeding Support Group. Go to  www.conehealthybaby.com or call [phone] for more information. Time for your Relationship It's easy to get so caught up in meeting baby's immediate needs that it's hard to find time to connect with your partner, and meet the needs of your relationship. It's also easy to forget what "quality time with your partner" actually looks like. If you take your baby on a date, you'd be amazed how much of your couple time is spent feeding the baby, diapering the baby, admiring the baby, and talking about the baby. Dating: Try to take time for just the two of you. Babysitter tip: Sometimes when moms are breastfeeding a newborn, they find it hard to figure out how to schedule outings around baby's unpredictable feeding schedules. Have the babysitter come for a three hour period. When she comes over, if baby has just eaten, you can leave right away, and come back in two hours. If baby hasn't fed recently, you start the date at home. Once baby gets hungry and gets a good feeding in, you can head out for the rest of your date time. Date Nights at Home: If you can't get out, at least set aside one evening a week to prioritize your relationship: whenever baby dozes off or doesn't have any immediate needs, spend a little time focusing on each other. Potential conflicts: The main relationship conflicts that come up for new parents are: issues related to sexuality, financial stresses, a feeling of an unfair division of household tasks, and conflicts in parenting styles. The more you can work on these issues before baby arrives, the better!  Fun and Frills (Don't forget these. and don't feel guilty for indulging in them!) Everyone has something in life that is a fun little treat that they do just for themselves. It may be: reading the morning paper, or going for a daily jog, or having coffee with a friend once a week, or going  to a movie on Friday nights, or fine chocolates, or bubble baths, or curling up with a good  book. Unless you do fun things for yourself every now and then, it's hard to have the energy for fun with your baby. Whatever your "special" treats are, make sure you find a way to continue to indulge in them after your baby is born. These special moments can recharge you, and allow you to return to baby with a new joy   PERINATAL MOOD DISORDERS: MATERNAL MENTAL HEALTH FROM CONCEPTION THROUGH THE POSTPARTUM PERIOD   _________________________________________Emergency and Crisis Resources If you are an imminent risk to self or others, are experiencing intense personal distress, and/or have noticed significant changes in activities of daily living, call:  [facility]: [phone]  [address], [place], [place], [address] Mobile Crisis: [phone] National Suicide Hotline: 58 Or visit the following crisis centers: Local Emergency Departments [facility]:  [address], [place]  Mon-Friday 8am-3pm, [phone]                                                                                  ___________ Non-Crisis Resources To identify specific providers that are covered by your insurance, contact your insurance company or local agencies:   Postpartum Support International- Warm-line: 810-395-0962                                                      __Outpatient Therapy and Medication Management   Providers:  [facility]: [phone] Hours: 9AM-5PM  Insurance Accepted: Babetta Lesch, BCBS, Karsten Page, [place], Medicare  [employer] [facility] of Care): [phone] Hours: 8AM-5:30PM  nsurance Accepted: All insurances EXCEPT AARP, [place], [place], and [employer] of the [place]: [phone] Hours: 8AM-8PM Insurance Accepted: Tiffany Foerster, Karsten Page, [place], Medicare, [phone] [facility]: [phone] Hours: 8:30AM-7PM Insurance Accepted: Tiffany Foerster, Medicaid, Medicare, Tricare, [employer] [facility]:  [phone]   Hours:9AM-5PM Insurance Accepted:  Raina Bunting, Mabeline Savant, [employer], [place], [employer] Care  [facility]: [phone] Hours: 9AM-5:30PM Insurance Accepted: Babetta Lesch, Jaylene Metz, and Medicaid, Medicare, [facility]:  [phone] Hours: 9am-5pm Insurance Accepted: BCBS; they do not accept Medicaid/Medicare [facility]: [phone] Hours: 9am-9pm Insurance Accepted: All major insurance including Medicaid and Medicare [facility]: [phone] Hours: 9AM- 5PM Insurance Accepted: All insurances EXCEPT Medicaid and Medicare. [facility]: [phone]   ____________                                                                     Parenting Support Groups [facility] at [facility] :  [address] [place], [place],  [address] [phone] [facility]:  [phone] Family Support Network: (support for children in the NICU and/or with special needs), [phone]     _____________                                                                                  Online Resources Postpartum Support International: SeekAlumni.co.za  800-944-4PPD Supporting Moms:  www.momssupportingmoms.net  [facility]  [address], [place], [place] [address] [phone] or [phone] [facility] 24/7 FOR ANYONE [address] [place], [place]  [phone] Fax: [phone] [email or web address] *Interpreters available *Accepts all insurance and uninsured for Urgent Care needs *Accepts Medicaid and uninsured for outpatient treatment (below)    ONLY FOR [facility]  Below:   Outpatient New Patient Assessment/Therapy Walk-ins:        Monday -Thursday 8am until slots are full.        Every  Friday 1pm-4pm  (first come, first served)                   New Patient Psychiatry/Medication Management        Monday-Friday 8am-11am (first come, first served)              For all walk-ins we ask that you arrive by 7:15am, because patients will be seen in the order of arrival.

## 2024-02-16 NOTE — Progress Notes (Signed)
 PRENATAL VISIT NOTE  Subjective:  Brandi Manning is a 28 y.o. G1P0 at [redacted]w[redacted]d being seen today for ongoing prenatal care.  She is currently monitored for the following issues for this low-risk pregnancy and has Supervision of high risk pregnancy, antepartum and Obesity affecting pregnancy, antepartum on their problem list.  Patient reports no bleeding, no contractions, no cramping, and no leaking.  Contractions: Not present. Vag. Bleeding: None.  Movement: Present. Denies leaking of fluid.   The following portions of the patient's history were reviewed and updated as appropriate: allergies, current medications, past family history, past medical history, past social history, past surgical history and problem list.   Objective:    Vitals:   02/16/24 1024  BP: 124/82  Pulse: (!) 120  Weight: (!) 319 lb 10.7 oz (145 kg)    Fetal Status:  Fetal Heart Rate (bpm): 133   Movement: Present    General: Alert, oriented and cooperative. Patient is in no acute distress.  Skin: Skin is warm and dry. No rash noted.   Cardiovascular: Normal heart rate noted  Respiratory: Normal respiratory effort, no problems with respiration noted  Abdomen: Soft, gravid, appropriate for gestational age.  Pain/Pressure: Absent     Pelvic: Cervical exam deferred        Extremities: Normal range of motion.  Edema: None  Mental Status: Normal mood and affect. Normal behavior. Normal judgment and thought content.   Assessment and Plan:  Pregnancy: G1P0 at [redacted]w[redacted]d 1. Supervision of high risk pregnancy, antepartum FHR and BP appropriate today  2. Obesity affecting pregnancy, antepartum, unspecified obesity type Scheduled for growth ultrasound on 7/2  3. [redacted] weeks gestation of pregnancy (Primary) Continue routine prenatal care Tdap vaccine today - Tdap vaccine greater than or equal to 7yo IM  4. Anxiety and depression Patient reports chronic history of issues with anxiety and depression.  Filled up PHQ-9 and  scored a 20.  Previously tried on Prozac  and Zoloft but reports she was started on the initial dose and it was never titrated and then just discontinued because she felt no different.  Reports no SI or HI but does report she sometimes wishes she were not around.  Discussed the option of seeing psychiatry, speaking with our therapist, medications.  Spoke with Jamie who will see the patient today and further investigate treatment options.  Discussed starting Zoloft and reevaluating in 2 weeks with possibility of increasing dosage.  Patient will consider.  Preterm labor symptoms and general obstetric precautions including but not limited to vaginal bleeding, contractions, leaking of fluid and fetal movement were reviewed in detail with the patient. Please refer to After Visit Summary for other counseling recommendations.   No follow-ups on file.  Future Appointments  Date Time Provider Department Center  03/02/2024  1:15 PM Ebony Goldstein, MD [facility]  03/08/2024  9:00 AM [facility] PROVIDER 1 [facility]  03/08/2024  9:30 AM [facility] US2 [facility]  03/15/2024  9:30 AM [facility] NURSE [facility]  03/15/2024  9:45 AM [facility] NST [facility]  03/16/2024 10:35 AM Teena Feast, MD [facility]  03/22/2024  9:30 AM [facility] NURSE [facility]  03/22/2024  9:45 AM [facility] NST [facility]  03/29/2024  9:30 AM [facility] NURSE [facility]  03/29/2024  9:45 AM [facility] NST [facility]  03/30/2024 10:35 AM Teena Feast, MD [facility]  04/05/2024  9:00 AM [facility] PROVIDER 1 [facility]  04/05/2024  9:30 AM [facility] US1 [facility]  04/06/2024 10:35  AM Teena Feast, MD [facility]    Ferdie Housekeeper, MD

## 2024-02-22 ENCOUNTER — Ambulatory Visit

## 2024-02-23 ENCOUNTER — Encounter: Payer: Self-pay | Admitting: Family Medicine

## 2024-02-23 DIAGNOSIS — R519 Headache, unspecified: Secondary | ICD-10-CM

## 2024-02-24 MED ORDER — CYCLOBENZAPRINE HCL 10 MG PO TABS: 10.0000 mg | ORAL_TABLET | Freq: Three times a day (TID) | ORAL | 1 refills | Status: DC | PRN

## 2024-03-02 ENCOUNTER — Ambulatory Visit (INDEPENDENT_AMBULATORY_CARE_PROVIDER_SITE_OTHER): Admitting: Family Medicine

## 2024-03-02 ENCOUNTER — Other Ambulatory Visit: Payer: Self-pay

## 2024-03-02 VITALS — BP 115/81 | HR 93 | Wt 323.2 lb

## 2024-03-02 DIAGNOSIS — Z3A33 33 weeks gestation of pregnancy: Secondary | ICD-10-CM | POA: Diagnosis not present

## 2024-03-02 DIAGNOSIS — O0990 Supervision of high risk pregnancy, unspecified, unspecified trimester: Secondary | ICD-10-CM | POA: Diagnosis not present

## 2024-03-02 DIAGNOSIS — F32A Depression, unspecified: Secondary | ICD-10-CM

## 2024-03-02 DIAGNOSIS — O99210 Obesity complicating pregnancy, unspecified trimester: Secondary | ICD-10-CM

## 2024-03-02 DIAGNOSIS — F419 Anxiety disorder, unspecified: Secondary | ICD-10-CM | POA: Diagnosis not present

## 2024-03-02 NOTE — Progress Notes (Signed)
   Subjective:  Brandi Manning is a 28 y.o. G1P0 at [redacted]w[redacted]d being seen today for ongoing prenatal care.  She is currently monitored for the following issues for this high-risk pregnancy and has Supervision of high risk pregnancy, antepartum; Obesity affecting pregnancy, antepartum; and Anxiety and depression on their problem list.  Patient reports no complaints.  Contractions: Irritability. Vag. Bleeding: None.  Movement: Present. Denies leaking of fluid.   The following portions of the patient's history were reviewed and updated as appropriate: allergies, current medications, past family history, past medical history, past social history, past surgical history and problem list. Problem list updated.  Objective:   Vitals:   03/02/24 1334  BP: 115/81  Pulse: 93  Weight: (!) 323 lb 3.2 oz (146.6 kg)    Fetal Status: Fetal Heart Rate (bpm): 134 Fundal Height: 33 cm Movement: Present     General:  Alert, oriented and cooperative. Patient is in no acute distress.  Skin: Skin is warm and dry. No rash noted.   Cardiovascular: Normal heart rate noted  Respiratory: Normal respiratory effort, no problems with respiration noted  Abdomen: Soft, gravid, appropriate for gestational age. Pain/Pressure: Present     Pelvic: Vag. Bleeding: None     Cervical exam deferred        Extremities: Normal range of motion.  Edema: Trace  Mental Status: Normal mood and affect. Normal behavior. Normal judgment and thought content.   Urinalysis:      Assessment and Plan:  Pregnancy: G1P0 at [redacted]w[redacted]d  1. [redacted] weeks gestation of pregnancy (Primary) 2. Supervision of high risk pregnancy, antepartum 3. Obesity affecting pregnancy, antepartum, unspecified obesity type 5/28 @[redacted]w[redacted]d , cephalic, anterior placenta, 1355g, EFW 34%, nrl AFI, negative screen for GDM - continue following with MFM, f/u 7/2  4. Anxiety and depression Saw [facility] 6/11 to start journaling and other supportive strategies and will continue to follow  with [facility], accepted referral to psychiatry though has not seen yet, pt reports much improvement in symptoms, continues to decline SSRI  Preterm labor symptoms and general obstetric precautions including but not limited to vaginal bleeding, contractions, leaking of fluid and fetal movement were reviewed in detail with the patient. Please refer to After Visit Summary for other counseling recommendations.  Return in about 2 weeks (around 03/16/2024) for MB-HOB, GBS.   Jhonny Augustin BROCKS, MD

## 2024-03-08 ENCOUNTER — Ambulatory Visit: Attending: Obstetrics | Admitting: Maternal & Fetal Medicine

## 2024-03-08 ENCOUNTER — Ambulatory Visit

## 2024-03-08 VITALS — BP 131/82 | HR 92

## 2024-03-08 DIAGNOSIS — R7303 Prediabetes: Secondary | ICD-10-CM | POA: Diagnosis not present

## 2024-03-08 DIAGNOSIS — E669 Obesity, unspecified: Secondary | ICD-10-CM | POA: Diagnosis not present

## 2024-03-08 DIAGNOSIS — O99213 Obesity complicating pregnancy, third trimester: Secondary | ICD-10-CM | POA: Diagnosis not present

## 2024-03-08 DIAGNOSIS — Z362 Encounter for other antenatal screening follow-up: Secondary | ICD-10-CM

## 2024-03-08 DIAGNOSIS — Z3A34 34 weeks gestation of pregnancy: Secondary | ICD-10-CM

## 2024-03-08 DIAGNOSIS — O99810 Abnormal glucose complicating pregnancy: Secondary | ICD-10-CM | POA: Diagnosis not present

## 2024-03-08 DIAGNOSIS — O99891 Other specified diseases and conditions complicating pregnancy: Secondary | ICD-10-CM | POA: Diagnosis not present

## 2024-03-08 DIAGNOSIS — O403XX0 Polyhydramnios, third trimester, not applicable or unspecified: Secondary | ICD-10-CM

## 2024-03-08 DIAGNOSIS — O99210 Obesity complicating pregnancy, unspecified trimester: Secondary | ICD-10-CM

## 2024-03-08 DIAGNOSIS — O99212 Obesity complicating pregnancy, second trimester: Secondary | ICD-10-CM

## 2024-03-08 DIAGNOSIS — O3663X0 Maternal care for excessive fetal growth, third trimester, not applicable or unspecified: Secondary | ICD-10-CM

## 2024-03-08 DIAGNOSIS — O0990 Supervision of high risk pregnancy, unspecified, unspecified trimester: Secondary | ICD-10-CM

## 2024-03-08 NOTE — Progress Notes (Signed)
 Patient information  Patient Name: Brandi Manning  Patient MRN:   [id number]  Referring practice: MFM Referring Provider: [facility])  Problem List   Patient Active Problem List   Diagnosis Date Noted   Anxiety and depression 02/16/2024   Obesity affecting pregnancy, antepartum 11/19/2023   Supervision of high risk pregnancy, antepartum 09/21/2023   Maternal Fetal medicine Consult  Brandi Manning is a 28 y.o. G1P0 at [redacted]w[redacted]d here for ultrasound and consultation. Brandi Manning is doing well today with no acute concerns. Today we focused on the following:   Polyhydramnios Polyhydramnios was seen on today's ultrasound. There cardiac anatomy appears normal. There is no evidence of chorioangioma. The fetal movement is normal without evidence of arthrogryposis. The fetal stomach and bladder appears normal. The fetal palate (although limited in views) and neck appears to be normal without evidence of mass. There is no evidence of of lower spine or pelvic abnormalities.   I discussed the diagnosis, management, prognosis and clinical implications of polyhydramnios and the clinical implications during pregnancy. Amniotic fluid is made from the fetal kidneys and at the normal fluid ranges from 5 to 25 cm on amniotic fluid index.  Potential causes of elevated amniotic fluid in pregnancy. I discussed that most the time the cause is unknown but the most common cause that is identifiable is maternal diabetes.  Also discussed that there is a potential for birth defects or neurologic conditions that can result in elevated amniotic fluid. Potential complications associated with polyhydramnios include malpresentation, increased risk of cord prolapse, increased discomfort during the end of pregnancy, increased risk of preterm contractions and increased risk of stillbirth.  In particular the risk of fetal anomalies and neonatal abnormalities increases with the degree of polyhydramnios.   Approximately 1% of newborns and 5 to 10% of fetuses with mild polyhydramnios will have an abnormality (tracheoesophageal fistula are among the most common abnormalities associated with polyhydramnios).  This increases to 2-5 times a risk with moderate and severe polyhydramnios.  Currently there is no treatment of polyhydramnios if it is idiopathic or due to fetal abnormalities.  If the elevation in fluid is related to maternal diabetes, better glucose control should reduce the degree of polyhydramnios from osmotic diuresis.  Large for gestational age The estimated fetal weight on today's ultrasound is consistent with large for gestational age (LGA). I discussed the diagnosis, management, and clinical significance of an LGA fetus. Risk factors for LGA include, but are not limited to, constitutional factors, pre-existing or gestational diabetes, maternal pre-pregnancy obesity, excessive gestational weight gain, abnormal fasting or postprandial blood glucose levels, dyslipidemia, a history of prior macrosomic newborns (defined as birth weight over 4000 g), and post-term pregnancy. Racial and ethnic factors may also contribute. Diagnosis is made via prenatal ultrasound; however, the prediction of neonatal weight remains imprecise, with an estimated variance of up to 20% between the expected and actual birth weight. Notably, ultrasound accuracy declines as fetal weight exceeds 4000 g, making precise estimation increasingly difficult. LGA serves as a surrogate marker for fetal macrosomia, which is defined by an absolute birth weight of over 4000 g, regardless of gestational age. The estimated fetal weight at the time of delivery may influence the mode of delivery. In the absence of gestational diabetes, cesarean delivery should be considered if the estimated fetal weight exceeds 5000 g to reduce the risk of higher-order perineal lacerations, which may lead to urinary or fecal incontinence, pelvic organ prolapse, and  shoulder dystocia--with or  without permanent fetal neurologic injury or, in rare cases, fetal death.  The patient had time to ask questions that were answered to her satisfaction.  She verbalized understanding and agrees to proceed with the plan below.  Sonographic findings Single intrauterine pregnancy. Fetal cardiac activity: Observed. Presentation: Cephalic. Interval fetal anatomy appears normal. Fetal biometry shows the estimated fetal weight at the 92 percentile. Amniotic fluid: Polyhydramnios.  MVP: 9.83 cm. Placenta: Anterior. BPP: 8/8.   There are limitations of prenatal ultrasound such as the inability to detect certain abnormalities due to poor visualization. Various factors such as fetal position, gestational age and maternal body habitus may increase the difficulty in visualizing the fetal anatomy.    Recommendations - Continue weekly antenatal testing and serial growth ultrasounds as scheduled.  Delivery around 39 weeks or sooner if indicated pending the final estimated fetal weight.  Review of Systems: A review of systems was performed and was negative except per HPI   Vitals and Physical Exam    03/08/2024    9:14 AM 03/02/2024    1:34 PM 02/16/2024   10:24 AM  Vitals with BMI  Weight  323 lbs 3 oz 319 lbs 11 oz  Systolic 131 115 875  Diastolic 82 81 82  Pulse 92 93 120    Sitting comfortably on the sonogram table Nonlabored breathing Normal rate and rhythm Abdomen is nontender  Past pregnancies OB History  Gravida Para Term Preterm AB Living  1       SAB IAB Ectopic Multiple Live Births          # Outcome Date GA Lbr Len/2nd Weight Sex Type Anes PTL Lv  1 Current              I spent 20 minutes reviewing the patients chart, including labs and images as well as counseling the patient about her medical conditions. Greater than 50% of the time was spent in direct face-to-face patient counseling.  Brandi Manning  MFM, [place]   03/08/2024  10:14 AM

## 2024-03-15 ENCOUNTER — Ambulatory Visit: Admitting: *Deleted

## 2024-03-15 ENCOUNTER — Ambulatory Visit: Attending: Obstetrics | Admitting: *Deleted

## 2024-03-15 VITALS — BP 139/77 | HR 74

## 2024-03-15 DIAGNOSIS — Z3A35 35 weeks gestation of pregnancy: Secondary | ICD-10-CM

## 2024-03-15 DIAGNOSIS — O99213 Obesity complicating pregnancy, third trimester: Secondary | ICD-10-CM

## 2024-03-15 DIAGNOSIS — O99210 Obesity complicating pregnancy, unspecified trimester: Secondary | ICD-10-CM

## 2024-03-15 DIAGNOSIS — O0990 Supervision of high risk pregnancy, unspecified, unspecified trimester: Secondary | ICD-10-CM

## 2024-03-15 NOTE — Procedures (Signed)
 Brandi Manning 01/26/1996 [redacted]w[redacted]d  Fetus A Non-Stress Test Interpretation for 03/15/24  Indication: morbid obesity  Fetal Heart Rate A Mode: External Baseline Rate (A): 135 bpm Variability: Moderate Accelerations: 15 x 15 Decelerations: Variable Multiple birth?: No  Uterine Activity Mode: Toco Contraction Frequency (min): none Resting Tone Palpated: Relaxed  Interpretation (Fetal Testing) Nonstress Test Interpretation: Reactive Comments: Tracing reviewed byDr. Arna

## 2024-03-15 NOTE — Patient Instructions (Signed)
 Brandi Manning

## 2024-03-16 ENCOUNTER — Encounter: Payer: Self-pay | Admitting: Family Medicine

## 2024-03-16 ENCOUNTER — Ambulatory Visit: Admitting: Family Medicine

## 2024-03-16 ENCOUNTER — Other Ambulatory Visit (HOSPITAL_COMMUNITY)
Admission: RE | Admit: 2024-03-16 | Discharge: 2024-03-16 | Disposition: A | Discharge status: Home / Self Care | Source: Ambulatory Visit | Attending: Family Medicine | Admitting: Family Medicine

## 2024-03-16 ENCOUNTER — Other Ambulatory Visit: Payer: Self-pay

## 2024-03-16 VITALS — BP 109/78 | HR 85 | Wt 328.0 lb

## 2024-03-16 DIAGNOSIS — O3660X0 Maternal care for excessive fetal growth, unspecified trimester, not applicable or unspecified: Secondary | ICD-10-CM | POA: Insufficient documentation

## 2024-03-16 DIAGNOSIS — O99213 Obesity complicating pregnancy, third trimester: Secondary | ICD-10-CM | POA: Diagnosis not present

## 2024-03-16 DIAGNOSIS — O0993 Supervision of high risk pregnancy, unspecified, third trimester: Secondary | ICD-10-CM

## 2024-03-16 DIAGNOSIS — Z3A35 35 weeks gestation of pregnancy: Secondary | ICD-10-CM

## 2024-03-16 DIAGNOSIS — N898 Other specified noninflammatory disorders of vagina: Secondary | ICD-10-CM | POA: Insufficient documentation

## 2024-03-16 DIAGNOSIS — O99210 Obesity complicating pregnancy, unspecified trimester: Secondary | ICD-10-CM

## 2024-03-16 DIAGNOSIS — O0990 Supervision of high risk pregnancy, unspecified, unspecified trimester: Secondary | ICD-10-CM

## 2024-03-16 DIAGNOSIS — O26893 Other specified pregnancy related conditions, third trimester: Secondary | ICD-10-CM | POA: Diagnosis not present

## 2024-03-16 DIAGNOSIS — O403XX0 Polyhydramnios, third trimester, not applicable or unspecified: Secondary | ICD-10-CM

## 2024-03-16 DIAGNOSIS — F419 Anxiety disorder, unspecified: Secondary | ICD-10-CM

## 2024-03-16 DIAGNOSIS — F32A Depression, unspecified: Secondary | ICD-10-CM

## 2024-03-16 DIAGNOSIS — O409XX0 Polyhydramnios, unspecified trimester, not applicable or unspecified: Secondary | ICD-10-CM | POA: Insufficient documentation

## 2024-03-16 DIAGNOSIS — O3663X0 Maternal care for excessive fetal growth, third trimester, not applicable or unspecified: Secondary | ICD-10-CM

## 2024-03-16 DIAGNOSIS — O99343 Other mental disorders complicating pregnancy, third trimester: Secondary | ICD-10-CM

## 2024-03-16 NOTE — Progress Notes (Signed)
   Subjective:  Brandi Manning is a 28 y.o. G1P0 at [redacted]w[redacted]d being seen today for ongoing prenatal care.  She is currently monitored for the following issues for this high-risk pregnancy and has Supervision of high risk pregnancy, antepartum; Obesity affecting pregnancy, antepartum; Anxiety and depression; Polyhydramnios; and LGA (large for gestational age) fetus affecting management of mother on their problem list.  Patient reports no complaints.  Contractions: Irritability. Vag. Bleeding: None.  Movement: Present. Denies leaking of fluid.   The following portions of the patient's history were reviewed and updated as appropriate: allergies, current medications, past family history, past medical history, past social history, past surgical history and problem list. Problem list updated.  Objective:   Vitals:   03/16/24 1047  BP: 109/78  Pulse: 85  Weight: (!) 328 lb (148.8 kg)    Fetal Status: Fetal Heart Rate (bpm): 135   Movement: Present     General:  Alert, oriented and cooperative. Patient is in no acute distress.  Skin: Skin is warm and dry. No rash noted.   Cardiovascular: Normal heart rate noted  Respiratory: Normal respiratory effort, no problems with respiration noted  Abdomen: Soft, gravid, appropriate for gestational age. Pain/Pressure: Present     Pelvic: Vag. Bleeding: None Vag D/C Character: White   Cervical exam deferred        Extremities: Normal range of motion.  Edema: Trace  Mental Status: Normal mood and affect. Normal behavior. Normal judgment and thought content.   Urinalysis:      Assessment and Plan:  Pregnancy: G1P0 at [redacted]w[redacted]d  1. Vaginal discharge during pregnancy in third trimester (Primary) Self swab today for symptoms of discharge and odor - Cervicovaginal ancillary only( [place])  2. Supervision of high risk pregnancy, antepartum Bp and FHR normal Swabs next visit Contraception discussed, still unsure, will discuss with partner  3. Obesity  affecting pregnancy, antepartum, unspecified obesity type F/w MFM, see below  4. Anxiety and depression Reports she is doing OK  5. Polyhydramnios in third trimester complication, single or unspecified fetus Last growth US  03/08/2024 EFW 92%, AC 98%, AFI 26.1 cm, MVP 9.8 cm Normal 2hr GTT earlier in pregnancy Delivery by 39 weeks per MFM, schedule at next visit  6. Excessive fetal growth affecting management of pregnancy in third trimester, single or unspecified fetus See above  Preterm labor symptoms and general obstetric precautions including but not limited to vaginal bleeding, contractions, leaking of fluid and fetal movement were reviewed in detail with the patient. Please refer to After Visit Summary for other counseling recommendations.  Return in 1 week (on 03/23/2024) for Dyad patient, ob visit.   Lola Donnice HERO, MD

## 2024-03-16 NOTE — Patient Instructions (Signed)

## 2024-03-17 LAB — CERVICOVAGINAL ANCILLARY ONLY
Bacterial Vaginitis (gardnerella): POSITIVE — AB
Candida Glabrata: NEGATIVE
Candida Vaginitis: NEGATIVE
Chlamydia: NEGATIVE
Comment: NEGATIVE
Comment: NEGATIVE
Comment: NEGATIVE
Comment: NEGATIVE
Comment: NEGATIVE
Comment: NORMAL
Neisseria Gonorrhea: NEGATIVE
Trichomonas: NEGATIVE

## 2024-03-20 ENCOUNTER — Ambulatory Visit: Payer: Self-pay | Admitting: Family Medicine

## 2024-03-20 MED ORDER — METRONIDAZOLE 0.75 % VA GEL
1.0000 | Freq: Every day | VAGINAL | 0 refills | Status: AC
Start: 2024-03-20 — End: 2024-03-25

## 2024-03-22 ENCOUNTER — Ambulatory Visit: Admitting: *Deleted

## 2024-03-22 ENCOUNTER — Ambulatory Visit: Attending: Obstetrics | Admitting: *Deleted

## 2024-03-22 VITALS — BP 136/75 | HR 87

## 2024-03-22 DIAGNOSIS — E669 Obesity, unspecified: Secondary | ICD-10-CM

## 2024-03-22 DIAGNOSIS — O99213 Obesity complicating pregnancy, third trimester: Secondary | ICD-10-CM | POA: Diagnosis not present

## 2024-03-22 DIAGNOSIS — O403XX0 Polyhydramnios, third trimester, not applicable or unspecified: Secondary | ICD-10-CM

## 2024-03-22 DIAGNOSIS — O0990 Supervision of high risk pregnancy, unspecified, unspecified trimester: Secondary | ICD-10-CM

## 2024-03-22 DIAGNOSIS — Z3A36 36 weeks gestation of pregnancy: Secondary | ICD-10-CM

## 2024-03-22 DIAGNOSIS — O3663X0 Maternal care for excessive fetal growth, third trimester, not applicable or unspecified: Secondary | ICD-10-CM

## 2024-03-22 DIAGNOSIS — O99210 Obesity complicating pregnancy, unspecified trimester: Secondary | ICD-10-CM

## 2024-03-22 NOTE — Procedures (Signed)
 Brandi Manning 07/06/1996 [redacted]w[redacted]d  Fetus A Non-Stress Test Interpretation for 03/22/24-NST  Indication: obese  Fetal Heart Rate A Mode: External Baseline Rate (A): 135 bpm Variability: Moderate Accelerations: 15 x 15 Decelerations: None Multiple birth?: No  Uterine Activity Mode: Toco Contraction Frequency (min): none Resting Tone Palpated: Relaxed  Interpretation (Fetal Testing) Nonstress Test Interpretation: Reactive

## 2024-03-23 ENCOUNTER — Other Ambulatory Visit (HOSPITAL_COMMUNITY)
Admission: RE | Admit: 2024-03-23 | Discharge: 2024-03-23 | Disposition: A | Discharge status: Home / Self Care | Source: Ambulatory Visit | Attending: Family Medicine | Admitting: Family Medicine

## 2024-03-23 ENCOUNTER — Ambulatory Visit: Admitting: Family Medicine

## 2024-03-23 ENCOUNTER — Encounter: Payer: Self-pay | Admitting: Family Medicine

## 2024-03-23 ENCOUNTER — Other Ambulatory Visit: Payer: Self-pay

## 2024-03-23 VITALS — BP 103/69 | HR 101 | Wt 338.0 lb

## 2024-03-23 DIAGNOSIS — O99213 Obesity complicating pregnancy, third trimester: Secondary | ICD-10-CM | POA: Diagnosis not present

## 2024-03-23 DIAGNOSIS — O0993 Supervision of high risk pregnancy, unspecified, third trimester: Secondary | ICD-10-CM

## 2024-03-23 DIAGNOSIS — O3663X0 Maternal care for excessive fetal growth, third trimester, not applicable or unspecified: Secondary | ICD-10-CM

## 2024-03-23 DIAGNOSIS — O403XX0 Polyhydramnios, third trimester, not applicable or unspecified: Secondary | ICD-10-CM

## 2024-03-23 DIAGNOSIS — O0990 Supervision of high risk pregnancy, unspecified, unspecified trimester: Secondary | ICD-10-CM

## 2024-03-23 DIAGNOSIS — O99210 Obesity complicating pregnancy, unspecified trimester: Secondary | ICD-10-CM

## 2024-03-23 DIAGNOSIS — Z3A36 36 weeks gestation of pregnancy: Secondary | ICD-10-CM

## 2024-03-23 NOTE — Patient Instructions (Signed)

## 2024-03-23 NOTE — Progress Notes (Signed)
   Subjective:  Brandi Manning is a 27 y.o. G1P0 at [redacted]w[redacted]d being seen today for ongoing prenatal care.  She is currently monitored for the following issues for this low-risk pregnancy and has Supervision of high risk pregnancy, antepartum; Obesity affecting pregnancy, antepartum; Anxiety and depression; Polyhydramnios; and LGA (large for gestational age) fetus affecting management of mother on their problem list.  Patient reports no complaints.  Contractions: Irritability. Vag. Bleeding: None.  Movement: Present. Denies leaking of fluid.   The following portions of the patient's history were reviewed and updated as appropriate: allergies, current medications, past family history, past medical history, past social history, past surgical history and problem list. Problem list updated.  Objective:   Vitals:   03/23/24 1519  BP: 103/69  Pulse: (!) 101  Weight: (!) 338 lb (153.3 kg)    Fetal Status: Fetal Heart Rate (bpm): 130   Movement: Present     General:  Alert, oriented and cooperative. Patient is in no acute distress.  Skin: Skin is warm and dry. No rash noted.   Cardiovascular: Normal heart rate noted  Respiratory: Normal respiratory effort, no problems with respiration noted  Abdomen: Soft, gravid, appropriate for gestational age. Pain/Pressure: Present     Pelvic: Vag. Bleeding: None     Cervical exam deferred        Extremities: Normal range of motion.  Edema: None  Mental Status: Normal mood and affect. Normal behavior. Normal judgment and thought content.   Urinalysis:      Assessment and Plan:  Pregnancy: G1P0 at [redacted]w[redacted]d  1. Supervision of high risk pregnancy, antepartum (Primary) BP and FHR normal Swabs collected today Continues to have symptoms of vaginal discharge despite treatment with metrogel  (though still has two days left), repeat swab collected - Culture, beta strep (group b only) - Cervicovaginal ancillary only( [place])  2. Polyhydramnios in third  trimester complication, single or unspecified fetus Last growth US  03/08/2024 EFW 92%, AC 98%, AFI 26.1 cm, MVP 9.8 cm Normal 2hr GTT earlier in pregnancy Delivery by 39 weeks per MFM Discussed with patient, she is amenable for IOL on 04/10/2024 Form faxed, orders placed  3. Excessive fetal growth affecting management of pregnancy in third trimester, single or unspecified fetus See above  4. Obesity affecting pregnancy, antepartum, unspecified obesity type F/w MFM  Preterm labor symptoms and general obstetric precautions including but not limited to vaginal bleeding, contractions, leaking of fluid and fetal movement were reviewed in detail with the patient. Please refer to After Visit Summary for other counseling recommendations.  Return in 1 week (on 03/30/2024) for Dyad patient, ob visit.   Lola Donnice HERO, MD

## 2024-03-24 DIAGNOSIS — O0990 Supervision of high risk pregnancy, unspecified, unspecified trimester: Secondary | ICD-10-CM | POA: Diagnosis not present

## 2024-03-27 ENCOUNTER — Ambulatory Visit: Payer: Self-pay | Admitting: Family Medicine

## 2024-03-27 LAB — CERVICOVAGINAL ANCILLARY ONLY
Bacterial Vaginitis (gardnerella): POSITIVE — AB
Candida Glabrata: NEGATIVE
Candida Vaginitis: NEGATIVE
Chlamydia: NEGATIVE
Comment: NEGATIVE
Comment: NEGATIVE
Comment: NEGATIVE
Comment: NEGATIVE
Comment: NEGATIVE
Comment: NORMAL
Neisseria Gonorrhea: NEGATIVE
Trichomonas: NEGATIVE

## 2024-03-27 MED ORDER — METRONIDAZOLE 500 MG PO TABS: 500.0000 mg | ORAL_TABLET | Freq: Two times a day (BID) | ORAL | 0 refills | Status: AC

## 2024-03-28 LAB — CULTURE, BETA STREP (GROUP B ONLY): Strep Gp B Culture: NEGATIVE

## 2024-03-29 ENCOUNTER — Ambulatory Visit

## 2024-03-29 ENCOUNTER — Ambulatory Visit: Attending: Obstetrics | Admitting: *Deleted

## 2024-03-29 VITALS — BP 136/72 | HR 95

## 2024-03-29 DIAGNOSIS — Z3A37 37 weeks gestation of pregnancy: Secondary | ICD-10-CM

## 2024-03-29 DIAGNOSIS — O403XX0 Polyhydramnios, third trimester, not applicable or unspecified: Secondary | ICD-10-CM | POA: Diagnosis not present

## 2024-03-29 DIAGNOSIS — O3663X0 Maternal care for excessive fetal growth, third trimester, not applicable or unspecified: Secondary | ICD-10-CM

## 2024-03-29 DIAGNOSIS — O99210 Obesity complicating pregnancy, unspecified trimester: Secondary | ICD-10-CM

## 2024-03-29 DIAGNOSIS — O0990 Supervision of high risk pregnancy, unspecified, unspecified trimester: Secondary | ICD-10-CM

## 2024-03-29 NOTE — Procedures (Signed)
 Brandi Manning July 17, 1996 [redacted]w[redacted]d  Fetus A Non-Stress Test Interpretation for 03/29/24  Indication: Polyhydramnios and LGA - NST only  Fetal Heart Rate A Mode: External Baseline Rate (A): 125 bpm Variability: Moderate Accelerations: 15 x 15 Decelerations: None Multiple birth?: No  Uterine Activity Mode: Palpation, Toco Contraction Frequency (min): none noted Resting Tone Palpated: Relaxed  Interpretation (Fetal Testing) Nonstress Test Interpretation: Reactive Comments: Reviewed with Dr. William

## 2024-03-30 ENCOUNTER — Inpatient Hospital Stay (HOSPITAL_COMMUNITY)

## 2024-03-30 ENCOUNTER — Other Ambulatory Visit: Payer: Self-pay

## 2024-03-30 ENCOUNTER — Inpatient Hospital Stay (HOSPITAL_COMMUNITY)
Admission: AD | Admit: 2024-03-30 | Discharge: 2024-03-30 | Disposition: A | Discharge status: Home / Self Care | Attending: Obstetrics & Gynecology | Admitting: Obstetrics & Gynecology

## 2024-03-30 ENCOUNTER — Encounter: Admitting: Family Medicine

## 2024-03-30 ENCOUNTER — Encounter (HOSPITAL_COMMUNITY): Payer: Self-pay | Admitting: Obstetrics & Gynecology

## 2024-03-30 DIAGNOSIS — R102 Pelvic and perineal pain: Secondary | ICD-10-CM | POA: Insufficient documentation

## 2024-03-30 DIAGNOSIS — O99213 Obesity complicating pregnancy, third trimester: Secondary | ICD-10-CM | POA: Diagnosis not present

## 2024-03-30 DIAGNOSIS — O289 Unspecified abnormal findings on antenatal screening of mother: Secondary | ICD-10-CM | POA: Diagnosis not present

## 2024-03-30 DIAGNOSIS — O479 False labor, unspecified: Secondary | ICD-10-CM

## 2024-03-30 DIAGNOSIS — O368330 Maternal care for abnormalities of the fetal heart rate or rhythm, third trimester, not applicable or unspecified: Secondary | ICD-10-CM | POA: Diagnosis not present

## 2024-03-30 DIAGNOSIS — Z3A37 37 weeks gestation of pregnancy: Secondary | ICD-10-CM

## 2024-03-30 DIAGNOSIS — O471 False labor at or after 37 completed weeks of gestation: Secondary | ICD-10-CM | POA: Insufficient documentation

## 2024-03-30 DIAGNOSIS — O288 Other abnormal findings on antenatal screening of mother: Secondary | ICD-10-CM

## 2024-03-30 LAB — URINALYSIS, ROUTINE W REFLEX MICROSCOPIC
Bilirubin Urine: NEGATIVE
Glucose, UA: NEGATIVE mg/dL
Hgb urine dipstick: NEGATIVE
Ketones, ur: NEGATIVE mg/dL
Leukocytes,Ua: NEGATIVE
Nitrite: NEGATIVE
Protein, ur: NEGATIVE mg/dL
Specific Gravity, Urine: 1.018 (ref 1.005–1.030)
pH: 6 (ref 5.0–8.0)

## 2024-03-30 NOTE — MAU Note (Signed)
 FHR tracing from timestamp 1022 to timestamp 1104 on paper only due to [facility] downtime.

## 2024-03-30 NOTE — MAU Provider Note (Signed)
 History     CSN: [id number]  Arrival date and time: 03/30/24 1000   None     Chief Complaint  Patient presents with   Pelvic Pain   Contractions   HPI Patient presents for increased pelvic pressure and contractions.  It was initially a labor eval and the nurse checked her cervix which was 0.5/thick/ballotable.  While having the labor eval it was noted that there NST had no accelerations.  Patient reported good fetal movement.  Patient had no other concerns.  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   Diabetes Maternal Grandmother    Heart disease Paternal Grandfather     Social History   Tobacco Use   Smoking status: Never   Smokeless tobacco: Never  Vaping Use   Vaping status: Never Used  Substance Use Topics   Alcohol use: Not Currently    Comment: occ   Drug use: No    Allergies: No Known Allergies  Medications Prior to Admission  Medication Sig Dispense Refill Last Dose/Taking   aspirin  EC 81 MG tablet Take 1 tablet (81 mg total) by mouth  daily. Start taking when you are [redacted] weeks pregnant for rest of pregnancy for prevention of preeclampsia 300 tablet 2 03/30/2024 Morning   metroNIDAZOLE  (FLAGYL ) 500 MG tablet Take 1 tablet (500 mg total) by mouth 2 (two) times daily for 7 days. 14 tablet 0 03/29/2024   Prenatal Vit-Fe Fumarate-FA (MULTIVITAMIN-PRENATAL) 27-0.8 MG TABS tablet Take 1 tablet by mouth daily at 12 noon.   03/29/2024    Review of Systems  Constitutional:  Negative for chills and fever.  HENT:  Negative for congestion.   Gastrointestinal:  Positive for abdominal distention. Negative for nausea and vomiting.  Genitourinary:  Negative for vaginal bleeding and vaginal discharge.   Physical Exam   Blood pressure 130/75, pulse 90, temperature 98.1 F (36.7 C), temperature source Oral, resp. rate 18, height 5' 8 (1.727 m), weight (!) 151.6 kg, last menstrual period 07/12/2023, SpO2 100%.  Physical Exam Vitals and nursing note reviewed.  Constitutional:      Appearance: Normal appearance.  HENT:     Head: Normocephalic and atraumatic.     Nose: No congestion or rhinorrhea.  Eyes:     Extraocular Movements: Extraocular movements intact.  Cardiovascular:     Rate and Rhythm: Normal rate.  Pulmonary:     Effort: Pulmonary effort is normal.  Abdominal:     Palpations: Abdomen is soft.     Tenderness: There is  no abdominal tenderness.  Genitourinary:    Comments: Closed/thick/ballotable Musculoskeletal:        General: Normal range of motion.     Cervical back: Normal range of motion.  Skin:    General: Skin is warm.     Capillary Refill: Capillary refill takes less than 2 seconds.  Neurological:     General: No focal deficit present.     Mental Status: She is alert.     Cranial Nerves: No cranial nerve deficit.  Psychiatric:        Mood and Affect: Mood normal.        Behavior: Behavior normal.     MAU Course  Procedures  MDM NST BPP  Assessment and Plan  Brandi Manning is a 28 year old G1, P0 at  37 weeks 3 days presenting for contractions and found to have nonreactive NST.  False labor Nonreactive NST Initial presentation for rule out labor.  0.5/thick/ballotable.  NST noted to be nonreactive.  BPP collected with 8 out of 8.  When I went to reevaluate the patient and the NST was showing accelerations at that time.  No D cells appreciated.  Moderate variability.  Discussed strict return precautions.  Patient discharged home.  Brandi Manning Brandi Manning 03/30/2024, 11:42 AM

## 2024-03-30 NOTE — MAU Note (Signed)
 MAU Labor Triage Note:  .Brandi Manning is a 28 y.o. at [redacted]w[redacted]d here in MAU reporting:  Contractions every: 5 minutes Onset of ctx: 0930 Pain Score: 8  Pain Location: Abdomen  ROM: none Vaginal Bleeding: none Last SVE: none Labor Pain Management Plan: Planning epidural  GBS: Negative  Fetal Movement: Reports positive FM FHT: Fetal Heart Rate Mode: External Baseline Rate (A): 135 bpm  Vitals:   03/30/24 1026  BP: 130/75  Pulse: 90  Resp: 18  Temp: 98.1 F (36.7 C)  SpO2: 100%      Lab orders placed from triage: MAU Labor Eval OB Office: Faculty

## 2024-04-05 ENCOUNTER — Ambulatory Visit: Admitting: Clinical

## 2024-04-05 ENCOUNTER — Ambulatory Visit

## 2024-04-05 ENCOUNTER — Ambulatory Visit: Admitting: Maternal & Fetal Medicine

## 2024-04-05 DIAGNOSIS — F411 Generalized anxiety disorder: Secondary | ICD-10-CM

## 2024-04-05 DIAGNOSIS — Z3A38 38 weeks gestation of pregnancy: Secondary | ICD-10-CM | POA: Insufficient documentation

## 2024-04-05 DIAGNOSIS — O3663X0 Maternal care for excessive fetal growth, third trimester, not applicable or unspecified: Secondary | ICD-10-CM

## 2024-04-05 DIAGNOSIS — O403XX0 Polyhydramnios, third trimester, not applicable or unspecified: Secondary | ICD-10-CM | POA: Insufficient documentation

## 2024-04-05 DIAGNOSIS — O99213 Obesity complicating pregnancy, third trimester: Secondary | ICD-10-CM

## 2024-04-05 DIAGNOSIS — O99210 Obesity complicating pregnancy, unspecified trimester: Secondary | ICD-10-CM

## 2024-04-05 DIAGNOSIS — F332 Major depressive disorder, recurrent severe without psychotic features: Secondary | ICD-10-CM

## 2024-04-05 DIAGNOSIS — O364XX0 Maternal care for intrauterine death, not applicable or unspecified: Secondary | ICD-10-CM | POA: Diagnosis not present

## 2024-04-05 DIAGNOSIS — Z658 Other specified problems related to psychosocial circumstances: Secondary | ICD-10-CM

## 2024-04-05 DIAGNOSIS — O0990 Supervision of high risk pregnancy, unspecified, unspecified trimester: Secondary | ICD-10-CM

## 2024-04-05 DIAGNOSIS — E669 Obesity, unspecified: Secondary | ICD-10-CM

## 2024-04-05 NOTE — Patient Instructions (Signed)
 Center for [facility] at [facility] [address] [place], [place] [address] [phone] (main office) [phone] (Konner Saiz's office)

## 2024-04-05 NOTE — Progress Notes (Signed)
   Patient information  Patient Name: Brandi Manning  Patient MRN:   [id number]  Referring practice: MFM Referring Provider: [facility])  Problem List   Patient Active Problem List   Diagnosis Date Noted   Polyhydramnios 03/16/2024   LGA (large for gestational age) fetus affecting management of mother 03/16/2024   Anxiety and depression 02/16/2024   Obesity affecting pregnancy, antepartum 11/19/2023   Supervision of high risk pregnancy, antepartum 09/21/2023    Maternal Fetal medicine Consult  Brandi Manning is a 28 y.o. G1P0 at [redacted]w[redacted]d here for ultrasound and consultation. Brandi Manning is doing well today with no acute concerns. Today we focused on the following:   She is here for polyhydramnios. BPP is 8/8. Delivery scheduled for 39 weeks.   The patient had time to ask questions that were answered to her satisfaction.  She verbalized understanding and agrees to proceed with the plan below.  Sonographic findings Single intrauterine pregnancy. Fetal cardiac activity: Observed. Presentation: Cephalic. Interval fetal anatomy appears normal. Fetal biometry shows the estimated fetal weight at the 79 percentile. Amniotic fluid: Polyhydramnios.  MVP: 10.05 cm. Placenta: Anterior. BPP: 8/8.   There are limitations of prenatal ultrasound such as the inability to detect certain abnormalities due to poor visualization. Various factors such as fetal position, gestational age and maternal body habitus may increase the difficulty in visualizing the fetal anatomy.    Recommendations -IOL on 8/4, precautions given, OB visit tomorrow.   Review of Systems: A review of systems was performed and was negative except per HPI   Vitals and Physical Exam    04/05/2024    9:29 AM 03/30/2024   12:03 PM 03/30/2024   10:26 AM  Vitals with BMI  Height   5' 8  Weight   334 lbs 5 oz  BMI   50.84  Systolic 127 139 869  Diastolic 82 80 75  Pulse 81  90    Sitting  comfortably on the sonogram table Nonlabored breathing Normal rate and rhythm Abdomen is nontender  Past pregnancies OB History  Gravida Para Term Preterm AB Living  1       SAB IAB Ectopic Multiple Live Births          # Outcome Date GA Lbr Len/2nd Weight Sex Type Anes PTL Lv  1 Current              I spent 30 minutes reviewing the patients chart, including labs and images as well as counseling the patient about her medical conditions. Greater than 50% of the time was spent in direct face-to-face patient counseling.  [name]  MFM, [facility]   04/05/2024  10:36 AM

## 2024-04-05 NOTE — BH Specialist Note (Signed)
 Less than 5 minutes on video; pt is currently completing ultrasound; agrees to attend scheduled postpartum mood check about two weeks postpartum; will call Warren as needed prior to scheduled visit at [phone].

## 2024-04-06 ENCOUNTER — Telehealth (HOSPITAL_COMMUNITY): Payer: Self-pay | Admitting: *Deleted

## 2024-04-06 ENCOUNTER — Encounter (HOSPITAL_COMMUNITY): Payer: Self-pay | Admitting: *Deleted

## 2024-04-06 ENCOUNTER — Other Ambulatory Visit: Payer: Self-pay

## 2024-04-06 ENCOUNTER — Ambulatory Visit (INDEPENDENT_AMBULATORY_CARE_PROVIDER_SITE_OTHER): Admitting: Family Medicine

## 2024-04-06 VITALS — BP 124/84 | HR 91 | Wt 331.0 lb

## 2024-04-06 DIAGNOSIS — Z3A38 38 weeks gestation of pregnancy: Secondary | ICD-10-CM

## 2024-04-06 DIAGNOSIS — O0990 Supervision of high risk pregnancy, unspecified, unspecified trimester: Secondary | ICD-10-CM | POA: Diagnosis not present

## 2024-04-06 DIAGNOSIS — O3663X0 Maternal care for excessive fetal growth, third trimester, not applicable or unspecified: Secondary | ICD-10-CM

## 2024-04-06 DIAGNOSIS — O403XX0 Polyhydramnios, third trimester, not applicable or unspecified: Secondary | ICD-10-CM | POA: Diagnosis not present

## 2024-04-06 DIAGNOSIS — O99210 Obesity complicating pregnancy, unspecified trimester: Secondary | ICD-10-CM

## 2024-04-06 NOTE — Progress Notes (Signed)
   Subjective:  Brandi Manning is a 28 y.o. G1P0 at [redacted]w[redacted]d being seen today for ongoing prenatal care.  She is currently monitored for the following issues for this high-risk pregnancy and has Supervision of high risk pregnancy, antepartum; Obesity affecting pregnancy, antepartum; Anxiety and depression; Polyhydramnios; and LGA (large for gestational age) fetus affecting management of mother on their problem list.  Patient reports no complaints.  Contractions: Irritability. Vag. Bleeding: None.  Movement: Present. Denies leaking of fluid.   The following portions of the patient's history were reviewed and updated as appropriate: allergies, current medications, past family history, past medical history, past social history, past surgical history and problem list. Problem list updated.  Objective:   Vitals:   04/06/24 1045  BP: 124/84  Pulse: 91  Weight: (!) 331 lb (150.1 kg)    Fetal Status: Fetal Heart Rate (bpm): 140   Movement: Present     General:  Alert, oriented and cooperative. Patient is in no acute distress.  Skin: Skin is warm and dry. No rash noted.   Cardiovascular: Normal heart rate noted  Respiratory: Normal respiratory effort, no problems with respiration noted  Abdomen: Soft, gravid, appropriate for gestational age. Pain/Pressure: Present (pelvic pressure)     Pelvic: Vag. Bleeding: None Vag D/C Character: Watery   Cervical exam deferred        Extremities: Normal range of motion.  Edema: Trace (bilateral ankles)  Mental Status: Normal mood and affect. Normal behavior. Normal judgment and thought content.   Urinalysis:      Assessment and Plan:  Pregnancy: G1P0 at [redacted]w[redacted]d  1. [redacted] weeks gestation of pregnancy (Primary)   2. Supervision of high risk pregnancy, antepartum BP an FHR normal  3. Polyhydramnios in third trimester complication, single or unspecified fetus Last growth US  04/05/2024 AFI 26.9 cm, MVP 10 cm Scheduled for IOL on 04/10/2024 Discussed call in  process as well as routine labor precautions  4. Excessive fetal growth affecting management of pregnancy in third trimester, single or unspecified fetus Resolved! Last growth US  04/05/2024 EFW 79%, AC 93%, 3634g  5. Obesity affecting pregnancy, antepartum, unspecified obesity type   Term labor symptoms and general obstetric precautions including but not limited to vaginal bleeding, contractions, leaking of fluid and fetal movement were reviewed in detail with the patient. Please refer to After Visit Summary for other counseling recommendations.  Return in 7 weeks (on 05/25/2024) for Dyad patient, PP check.   Lola Donnice HERO, MD

## 2024-04-06 NOTE — Telephone Encounter (Signed)
 Preadmission screen

## 2024-04-06 NOTE — Patient Instructions (Signed)

## 2024-04-08 ENCOUNTER — Inpatient Hospital Stay (HOSPITAL_COMMUNITY)
Admission: AD | Admit: 2024-04-08 | Discharge: 2024-04-11 | DRG: 805 | Disposition: A | Discharge status: Home / Self Care | Attending: Obstetrics and Gynecology | Admitting: Obstetrics and Gynecology

## 2024-04-08 ENCOUNTER — Inpatient Hospital Stay (HOSPITAL_BASED_OUTPATIENT_CLINIC_OR_DEPARTMENT_OTHER)

## 2024-04-08 DIAGNOSIS — E669 Obesity, unspecified: Secondary | ICD-10-CM | POA: Diagnosis not present

## 2024-04-08 DIAGNOSIS — E66813 Obesity, class 3: Secondary | ICD-10-CM | POA: Diagnosis not present

## 2024-04-08 DIAGNOSIS — F32A Depression, unspecified: Secondary | ICD-10-CM | POA: Diagnosis not present

## 2024-04-08 DIAGNOSIS — F419 Anxiety disorder, unspecified: Secondary | ICD-10-CM | POA: Diagnosis present

## 2024-04-08 DIAGNOSIS — R946 Abnormal results of thyroid function studies: Secondary | ICD-10-CM | POA: Diagnosis present

## 2024-04-08 DIAGNOSIS — O364XX0 Maternal care for intrauterine death, not applicable or unspecified: Principal | ICD-10-CM | POA: Diagnosis present

## 2024-04-08 DIAGNOSIS — Z833 Family history of diabetes mellitus: Secondary | ICD-10-CM

## 2024-04-08 DIAGNOSIS — O99210 Obesity complicating pregnancy, unspecified trimester: Secondary | ICD-10-CM | POA: Diagnosis present

## 2024-04-08 DIAGNOSIS — Z3A38 38 weeks gestation of pregnancy: Secondary | ICD-10-CM | POA: Diagnosis not present

## 2024-04-08 DIAGNOSIS — O3663X0 Maternal care for excessive fetal growth, third trimester, not applicable or unspecified: Secondary | ICD-10-CM

## 2024-04-08 DIAGNOSIS — O368330 Maternal care for abnormalities of the fetal heart rate or rhythm, third trimester, not applicable or unspecified: Secondary | ICD-10-CM | POA: Diagnosis not present

## 2024-04-08 DIAGNOSIS — Z8249 Family history of ischemic heart disease and other diseases of the circulatory system: Secondary | ICD-10-CM | POA: Diagnosis not present

## 2024-04-08 DIAGNOSIS — O99214 Obesity complicating childbirth: Secondary | ICD-10-CM | POA: Diagnosis not present

## 2024-04-08 DIAGNOSIS — O43893 Other placental disorders, third trimester: Secondary | ICD-10-CM | POA: Diagnosis not present

## 2024-04-08 DIAGNOSIS — O403XX0 Polyhydramnios, third trimester, not applicable or unspecified: Secondary | ICD-10-CM | POA: Diagnosis present

## 2024-04-08 DIAGNOSIS — O411290 Chorioamnionitis, unspecified trimester, not applicable or unspecified: Secondary | ICD-10-CM | POA: Diagnosis not present

## 2024-04-08 DIAGNOSIS — O409XX0 Polyhydramnios, unspecified trimester, not applicable or unspecified: Secondary | ICD-10-CM | POA: Diagnosis present

## 2024-04-08 DIAGNOSIS — O3660X0 Maternal care for excessive fetal growth, unspecified trimester, not applicable or unspecified: Secondary | ICD-10-CM | POA: Diagnosis present

## 2024-04-08 DIAGNOSIS — O09293 Supervision of pregnancy with other poor reproductive or obstetric history, third trimester: Secondary | ICD-10-CM

## 2024-04-08 DIAGNOSIS — O411230 Chorioamnionitis, third trimester, not applicable or unspecified: Secondary | ICD-10-CM | POA: Diagnosis present

## 2024-04-08 DIAGNOSIS — O99213 Obesity complicating pregnancy, third trimester: Secondary | ICD-10-CM | POA: Diagnosis not present

## 2024-04-08 DIAGNOSIS — O134 Gestational [pregnancy-induced] hypertension without significant proteinuria, complicating childbirth: Secondary | ICD-10-CM | POA: Diagnosis present

## 2024-04-08 DIAGNOSIS — Z3490 Encounter for supervision of normal pregnancy, unspecified, unspecified trimester: Principal | ICD-10-CM | POA: Diagnosis present

## 2024-04-08 DIAGNOSIS — R39198 Other difficulties with micturition: Secondary | ICD-10-CM | POA: Diagnosis not present

## 2024-04-08 DIAGNOSIS — Z3A00 Weeks of gestation of pregnancy not specified: Secondary | ICD-10-CM | POA: Diagnosis not present

## 2024-04-08 DIAGNOSIS — R7989 Other specified abnormal findings of blood chemistry: Secondary | ICD-10-CM | POA: Diagnosis present

## 2024-04-08 DIAGNOSIS — O139 Gestational [pregnancy-induced] hypertension without significant proteinuria, unspecified trimester: Secondary | ICD-10-CM | POA: Diagnosis present

## 2024-04-08 MED ORDER — ACETAMINOPHEN 325 MG PO TABS: 650.0000 mg | ORAL_TABLET | ORAL | Status: DC | PRN

## 2024-04-08 MED ORDER — OXYTOCIN-SODIUM CHLORIDE 30-0.9 UT/500ML-% IV SOLN: 2.5000 [IU]/h | INTRAVENOUS | Status: DC

## 2024-04-08 MED ORDER — TERBUTALINE SULFATE 1 MG/ML IJ SOLN: 0.2500 mg | Freq: Once | INTRAMUSCULAR | Status: DC | PRN

## 2024-04-08 MED ORDER — SOD CITRATE-CITRIC ACID 500-334 MG/5ML PO SOLN
30.0000 mL | ORAL | Status: DC | PRN
Start: 2024-04-08 — End: 2024-04-10

## 2024-04-08 MED ORDER — MISOPROSTOL 25 MCG QUARTER TABLET
25.0000 ug | ORAL_TABLET | Freq: Once | ORAL | Status: AC
  Administered 2024-04-09: 25 ug via VAGINAL
  Filled 2024-04-08: qty 1

## 2024-04-08 MED ORDER — TERBUTALINE SULFATE 1 MG/ML IJ SOLN
0.2500 mg | Freq: Once | INTRAMUSCULAR | Status: DC | PRN
Start: 2024-04-08 — End: 2024-04-08

## 2024-04-08 MED ORDER — OXYCODONE-ACETAMINOPHEN 5-325 MG PO TABS: 1.0000 | ORAL_TABLET | ORAL | Status: DC | PRN

## 2024-04-08 MED ORDER — ONDANSETRON HCL 4 MG/2ML IJ SOLN
4.0000 mg | Freq: Four times a day (QID) | INTRAMUSCULAR | Status: DC | PRN
  Administered 2024-04-10: 4 mg via INTRAVENOUS
  Filled 2024-04-08: qty 2

## 2024-04-08 MED ORDER — OXYTOCIN-SODIUM CHLORIDE 30-0.9 UT/500ML-% IV SOLN
2.5000 [IU]/h | INTRAVENOUS | Status: DC
  Filled 2024-04-08: qty 500

## 2024-04-08 MED ORDER — SOD CITRATE-CITRIC ACID 500-334 MG/5ML PO SOLN: 30.0000 mL | ORAL | Status: DC | PRN

## 2024-04-08 MED ORDER — LACTATED RINGERS IV SOLN: 500.0000 mL | INTRAVENOUS | Status: DC | PRN

## 2024-04-08 MED ORDER — MISOPROSTOL 50MCG HALF TABLET
50.0000 ug | ORAL_TABLET | Freq: Once | ORAL | Status: AC
  Administered 2024-04-09: 50 ug via ORAL
  Filled 2024-04-08: qty 1

## 2024-04-08 MED ORDER — LIDOCAINE HCL (PF) 1 % IJ SOLN: 30.0000 mL | INTRAMUSCULAR | Status: DC | PRN

## 2024-04-08 MED ORDER — LACTATED RINGERS IV SOLN: INTRAVENOUS | Status: DC

## 2024-04-08 MED ORDER — OXYTOCIN-SODIUM CHLORIDE 30-0.9 UT/500ML-% IV SOLN
1.0000 m[IU]/min | INTRAVENOUS | Status: DC
  Administered 2024-04-09: 2 m[IU]/min via INTRAVENOUS
  Filled 2024-04-08: qty 500

## 2024-04-08 MED ORDER — OXYCODONE-ACETAMINOPHEN 5-325 MG PO TABS: 2.0000 | ORAL_TABLET | ORAL | Status: DC | PRN

## 2024-04-08 MED ORDER — LIDOCAINE HCL (PF) 1 % IJ SOLN
30.0000 mL | INTRAMUSCULAR | Status: AC | PRN
  Administered 2024-04-09: 30 mL via SUBCUTANEOUS
  Filled 2024-04-08: qty 30

## 2024-04-08 MED ORDER — ONDANSETRON HCL 4 MG/2ML IJ SOLN: 4.0000 mg | Freq: Four times a day (QID) | INTRAMUSCULAR | Status: DC | PRN

## 2024-04-08 MED ORDER — ACETAMINOPHEN 325 MG PO TABS
650.0000 mg | ORAL_TABLET | ORAL | Status: DC | PRN
  Administered 2024-04-09 (×3): 650 mg via ORAL
  Filled 2024-04-08 (×3): qty 2

## 2024-04-08 MED ORDER — OXYTOCIN BOLUS FROM INFUSION: 333.0000 mL | Freq: Once | INTRAVENOUS | Status: DC

## 2024-04-08 MED ORDER — FENTANYL CITRATE (PF) 100 MCG/2ML IJ SOLN
50.0000 ug | INTRAMUSCULAR | Status: DC | PRN
  Administered 2024-04-09 (×2): 100 ug via INTRAVENOUS
  Filled 2024-04-08 (×2): qty 2

## 2024-04-08 MED ORDER — FENTANYL CITRATE (PF) 100 MCG/2ML IJ SOLN: 50.0000 ug | INTRAMUSCULAR | Status: DC | PRN

## 2024-04-08 MED ORDER — OXYTOCIN BOLUS FROM INFUSION
333.0000 mL | Freq: Once | INTRAVENOUS | Status: AC
  Administered 2024-04-09: 333 mL via INTRAVENOUS

## 2024-04-08 NOTE — MAU Note (Signed)
 MAU Triage Note:  .Brandi Manning is a 28 y.o. at [redacted]w[redacted]d here in MAU reporting: DFM today along with lower abdominal cramps that began around 1700 and is intermittent. Has tried drinking cold, sugary fluid and eating to stimulate baby, but has not felt any changes in FM. She also reports intermittent tightening that occurs every few minutes or so. Denies VB or LOF.   Patient complaint: cramping, DFM, nausea   Onset of complaint: DFM since last night LMP: Patient's last menstrual period was 07/12/2023.  FHT:   Unable to hear FHTs - triage stopped and patient taken directly to room. RN notified Harlene Duncans, CNM who met RN at bedside to perform BSUS.

## 2024-04-08 NOTE — MAU Note (Signed)
Ultrasonographer at bedside. 

## 2024-04-08 NOTE — MAU Note (Signed)
 RN called US  and requested to come to bedside.

## 2024-04-08 NOTE — MAU Note (Signed)
 Chaplain at bedside

## 2024-04-08 NOTE — H&P (Signed)
 Brandi Manning is a 28 y.o. female, G1P0 at 38.5  weeks, presenting for IUFD in setting of DFM.  Patient states she had not felt movement since Friday night despite resting and eating sweets. She reports she was seen on 04/05/2024 with BPP of 8/8 and EFW of 8lbs.   Patient Active Problem List   Diagnosis Date Noted   Polyhydramnios 03/16/2024   LGA (large for gestational age) fetus affecting management of mother 03/16/2024   Anxiety and depression 02/16/2024   Obesity affecting pregnancy, antepartum 11/19/2023   Supervision of high risk pregnancy, antepartum 09/21/2023    History of present pregnancy:  Last evaluation:  04/06/2024 in office with Dr. EMERSON Carolus.      Vitals:    04/06/24 1045  BP: 124/84  Pulse: 91  Weight: (!) 331 lb (150.1 kg)      Fetal Status: Fetal Heart Rate (bpm): 140   Movement: Present    NURSING  PROVIDER  [facility] Dating by LMP c/w U/S at 6.3 wks  PNC Model Traditional Anatomy U/S Normal anatomy>LGA, poly  Initiated care at  [employer]  English              LAB RESULTS   Support Person elijah Genetics NIPS:LR  AFP: normal    NT/IT (FT only)     Carrier Screen Horizon: neg 4/4  Rhogam  O/Positive/-- (01/14 1024) A1C/GTT Early HgbA1C: 5.7, early gtt normal  Third trimester 2 hr GTT: normal  Flu Vaccine Received at work 06/25/23    TDaP Vaccine  02/16/2024 Blood Type O/Positive/-- (01/14 1024)  RSV Vaccine  Antibody Negative (01/14 1024)  COVID Vaccine 2 doses pfizer Rubella 3.34 (01/14 1024)  Feeding Plan both RPR Non Reactive (01/14 1024)  Contraception no method HBsAg Negative (01/14 1024)  Circumcision yes HIV Non Reactive (01/14 1024)  Pediatrician  MBCC HCVAb Non Reactive (01/14 1024)  Prenatal Classes     BTL Consent  Pap Diagnosis  Date Value Ref Range Status  07/14/2019      - Negative for intraepithelial lesion or malignancy (NILM)    BTL Pre-payment  GC/CT Initial:  neg/neg 36wks:     VBAC Consent  GBS   For PCN allergy, check sensitivities   BRx Optimized? [ ]  yes   [ ]  no    DME Rx [ ]  BP cuff [ ]  Weight Scale Waterbirth  [ ]  Class [ ]  Consent [ ]  CNM visit  PHQ9 & GAD7 [  ] new OB [  ] 28 weeks  [  ] 36 weeks Induction  [ ]  Orders Entered [ ] Foley Y/N     OB History     Gravida  1   Para      Term      Preterm      AB      Living         SAB      IAB      Ectopic      Multiple      Live Births                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   Dysuria 07/29/2016   Encounter for contraceptive management 01/08/2017  Encounter for health maintenance examination in adult 01/16/2016   Goiter 11/16/2014   Headache    intermittent   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   Hypertriglyceridemia 06/28/2020   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 Social History:  reports that she has never smoked. She has never used smokeless tobacco. She reports that she does not currently use alcohol. She reports that she does not use drugs.   Prenatal Transfer Tool  Maternal Diabetes: No-Elevated A1C, but early screening normal Genetic Screening: Normal Maternal Ultrasounds/Referrals: Normal, but was followed for LGA Fetal Ultrasounds or other Referrals:  Referred  to Materal Fetal Medicine  Maternal Substance Abuse:  No Significant Maternal Medications:  Meds include: Other: ASA Significant Maternal Lab Results: Group B Strep negative   Maternal Assessment:  ROS: +Contractions, -LOF, -Vaginal Bleeding, -Fetal Movement  All other systems reviewed and negative.    No Known Allergies     Height 5' 8 (1.727 m), weight (!) 153 kg, last menstrual period 07/12/2023.  Physical Exam Vitals and nursing note reviewed. Exam conducted with a chaperone present (Benton, RN & Maurilio, RN).  Constitutional:      Appearance: Normal appearance. She is well-developed.  HENT:     Head: Normocephalic and atraumatic.  Eyes:     Conjunctiva/sclera: Conjunctivae normal.  Cardiovascular:     Rate and Rhythm: Normal rate.  Pulmonary:     Effort: Pulmonary effort is normal. No respiratory distress.  Abdominal:     Palpations: Abdomen is soft.     Tenderness: There is no abdominal tenderness.     Comments: Gravid, Appears LGA  Musculoskeletal:        General: Normal range of motion.     Cervical back: Normal range of motion.  Skin:    General: Skin is warm and dry.  Neurological:     Mental Status: She is alert and oriented to person, place, and time.  Psychiatric:        Mood and Affect: Mood normal.        Behavior: Behavior normal.     Fetal Assessment: Leopolds: -Pelvis: Not Evaluated -EFW: 8lbs by US  -Presentation: Cephalic    Assessment IUFD at 38.5 weeks Polyhydramnios Obesity-BMI 51 Anxiety and Depression  Plan: -US  confirms IUFD. -Patient informed and support given. -Briefly discussed, per patient request, next steps including induction, labor, and subsequent delivery. -Reviewed availability of pain medication when desired.  -Chaplain to bedside. -L&D team notified and orders placed.   Harlene LITTIE Duncans CNM, MSN 04/08/2024, 11:01 PM

## 2024-04-09 ENCOUNTER — Inpatient Hospital Stay (HOSPITAL_COMMUNITY): Admitting: Anesthesiology

## 2024-04-09 DIAGNOSIS — O411230 Chorioamnionitis, third trimester, not applicable or unspecified: Secondary | ICD-10-CM

## 2024-04-09 DIAGNOSIS — O139 Gestational [pregnancy-induced] hypertension without significant proteinuria, unspecified trimester: Secondary | ICD-10-CM | POA: Diagnosis present

## 2024-04-09 DIAGNOSIS — O364XX0 Maternal care for intrauterine death, not applicable or unspecified: Secondary | ICD-10-CM | POA: Diagnosis present

## 2024-04-09 DIAGNOSIS — O3663X0 Maternal care for excessive fetal growth, third trimester, not applicable or unspecified: Secondary | ICD-10-CM

## 2024-04-09 DIAGNOSIS — R7989 Other specified abnormal findings of blood chemistry: Secondary | ICD-10-CM | POA: Diagnosis present

## 2024-04-09 DIAGNOSIS — O403XX0 Polyhydramnios, third trimester, not applicable or unspecified: Secondary | ICD-10-CM

## 2024-04-09 DIAGNOSIS — Z3A38 38 weeks gestation of pregnancy: Secondary | ICD-10-CM

## 2024-04-09 DIAGNOSIS — O134 Gestational [pregnancy-induced] hypertension without significant proteinuria, complicating childbirth: Secondary | ICD-10-CM

## 2024-04-09 DIAGNOSIS — O411290 Chorioamnionitis, unspecified trimester, not applicable or unspecified: Secondary | ICD-10-CM | POA: Diagnosis not present

## 2024-04-09 HISTORY — DX: Gestational (pregnancy-induced) hypertension without significant proteinuria, unspecified trimester: O13.9

## 2024-04-09 LAB — CBC WITH DIFFERENTIAL/PLATELET
Abs Granulocyte: 5.5 K/uL (ref 1.5–6.5)
Abs Immature Granulocytes: 0.02 K/uL (ref 0.00–0.07)
Basophils Absolute: 0 K/uL (ref 0.0–0.1)
Basophils Relative: 0 %
Eosinophils Absolute: 0 K/uL (ref 0.0–0.5)
Eosinophils Relative: 0 %
HCT: 40.4 % (ref 36.0–46.0)
Hemoglobin: 13.4 g/dL (ref 12.0–15.0)
Immature Granulocytes: 0 %
Lymphocytes Relative: 18 %
Lymphs Abs: 1.3 K/uL (ref 0.7–4.0)
MCH: 29.1 pg (ref 26.0–34.0)
MCHC: 33.2 g/dL (ref 30.0–36.0)
MCV: 87.8 fL (ref 80.0–100.0)
Monocytes Absolute: 0.5 K/uL (ref 0.1–1.0)
Monocytes Relative: 6 %
Neutro Abs: 5.5 K/uL (ref 1.7–7.7)
Neutrophils Relative %: 76 %
Platelets: 230 K/uL (ref 150–400)
RBC: 4.6 MIL/uL (ref 3.87–5.11)
RDW: 13.6 % (ref 11.5–15.5)
WBC: 7.3 K/uL (ref 4.0–10.5)
nRBC: 0 % (ref 0.0–0.2)

## 2024-04-09 LAB — PROTEIN / CREATININE RATIO, URINE
Creatinine, Urine: 58 mg/dL
Total Protein, Urine: <6 mg/dL

## 2024-04-09 LAB — COMPREHENSIVE METABOLIC PANEL WITH GFR
ALT: 9 U/L (ref 0–44)
ALT: 10 U/L (ref 0–44)
AST: 21 U/L (ref 15–41)
AST: 20 U/L (ref 15–41)
Albumin: 2.7 g/dL — ABNORMAL LOW (ref 3.5–5.0)
Albumin: 2.7 g/dL — ABNORMAL LOW (ref 3.5–5.0)
Alkaline Phosphatase: 123 U/L (ref 38–126)
Alkaline Phosphatase: 129 U/L — ABNORMAL HIGH (ref 38–126)
Anion gap: 9 (ref 5–15)
Anion gap: 10 (ref 5–15)
BUN: <5 mg/dL — ABNORMAL LOW (ref 6–20)
BUN: <5 mg/dL — ABNORMAL LOW (ref 6–20)
CO2: 18 mmol/L — ABNORMAL LOW (ref 22–32)
CO2: 21 mmol/L — ABNORMAL LOW (ref 22–32)
Calcium: 9.5 mg/dL (ref 8.9–10.3)
Calcium: 9.2 mg/dL (ref 8.9–10.3)
Chloride: 109 mmol/L (ref 98–111)
Chloride: 107 mmol/L (ref 98–111)
Creatinine, Ser: 0.7 mg/dL (ref 0.44–1.00)
Creatinine, Ser: 0.78 mg/dL (ref 0.44–1.00)
GFR, Estimated: >60 mL/min (ref >60)
GFR, Estimated: >60 mL/min (ref >60)
Glucose, Bld: 102 mg/dL — ABNORMAL HIGH (ref 70–99)
Glucose, Bld: 79 mg/dL (ref 70–99)
Potassium: 3.7 mmol/L (ref 3.5–5.1)
Potassium: 4.1 mmol/L (ref 3.5–5.1)
Sodium: 136 mmol/L (ref 135–145)
Sodium: 138 mmol/L (ref 135–145)
Total Bilirubin: 0.2 mg/dL (ref 0.0–1.2)
Total Bilirubin: 0.3 mg/dL (ref 0.0–1.2)
Total Protein: 6.4 g/dL — ABNORMAL LOW (ref 6.5–8.1)
Total Protein: 5.9 g/dL — ABNORMAL LOW (ref 6.5–8.1)

## 2024-04-09 LAB — CBC
HCT: 40 % (ref 36.0–46.0)
HCT: 40.3 % (ref 36.0–46.0)
Hemoglobin: 13.3 g/dL (ref 12.0–15.0)
Hemoglobin: 13.6 g/dL (ref 12.0–15.0)
MCH: 29.1 pg (ref 26.0–34.0)
MCH: 29.1 pg (ref 26.0–34.0)
MCHC: 33.3 g/dL (ref 30.0–36.0)
MCHC: 33.7 g/dL (ref 30.0–36.0)
MCV: 86.3 fL (ref 80.0–100.0)
MCV: 87.5 fL (ref 80.0–100.0)
Platelets: 284 K/uL (ref 150–400)
Platelets: 315 K/uL (ref 150–400)
RBC: 4.57 MIL/uL (ref 3.87–5.11)
RBC: 4.67 MIL/uL (ref 3.87–5.11)
RDW: 13.6 % (ref 11.5–15.5)
RDW: 13.8 % (ref 11.5–15.5)
WBC: 7.1 K/uL (ref 4.0–10.5)
WBC: 7.5 K/uL (ref 4.0–10.5)
nRBC: 0 % (ref 0.0–0.2)
nRBC: 0 % (ref 0.0–0.2)

## 2024-04-09 LAB — DIC (DISSEMINATED INTRAVASCULAR COAGULATION)PANEL
D-Dimer, Quant: 1.36 ug{FEU}/mL — ABNORMAL HIGH (ref 0.00–0.50)
D-Dimer, Quant: 1.38 ug{FEU}/mL — ABNORMAL HIGH (ref 0.00–0.50)
Fibrinogen: 592 mg/dL — ABNORMAL HIGH (ref 210–475)
Fibrinogen: 610 mg/dL — ABNORMAL HIGH (ref 210–475)
INR: 0.9 (ref 0.8–1.2)
INR: 1 (ref 0.8–1.2)
Platelets: 308 K/uL (ref 150–400)
Platelets: 308 K/uL (ref 150–400)
Prothrombin Time: 12.8 s (ref 11.4–15.2)
Prothrombin Time: 13.2 s (ref 11.4–15.2)
Smear Review: NONE SEEN
Smear Review: NONE SEEN
aPTT: 31 s (ref 24–36)
aPTT: 31 s (ref 24–36)

## 2024-04-09 LAB — KLEIHAUER-BETKE STAIN
Fetal Cells %: 0 %
Quantitation Fetal Hemoglobin: 0 mL

## 2024-04-09 LAB — T4, FREE: Free T4: 0.8 ng/dL (ref 0.61–1.12)

## 2024-04-09 LAB — RPR: RPR Ser Ql: NONREACTIVE

## 2024-04-09 LAB — TYPE AND SCREEN
ABO/RH(D): O POS
Antibody Screen: NEGATIVE

## 2024-04-09 LAB — TSH: TSH: 5.536 u[IU]/mL — ABNORMAL HIGH (ref 0.350–4.500)

## 2024-04-09 LAB — ABO/RH: ABO/RH(D): O POS

## 2024-04-09 MED ORDER — EPHEDRINE 5 MG/ML INJ: 10.0000 mg | INTRAVENOUS | Status: DC | PRN

## 2024-04-09 MED ORDER — TRANEXAMIC ACID-NACL 1000-0.7 MG/100ML-% IV SOLN
1000.0000 mg | Freq: Once | INTRAVENOUS | Status: AC
  Administered 2024-04-09: 1000 mg via INTRAVENOUS

## 2024-04-09 MED ORDER — TRANEXAMIC ACID-NACL 1000-0.7 MG/100ML-% IV SOLN
INTRAVENOUS | Status: AC
  Filled 2024-04-09: qty 100

## 2024-04-09 MED ORDER — SODIUM CHLORIDE 0.9 % IV SOLN
2.0000 g | Freq: Four times a day (QID) | INTRAVENOUS | Status: DC
  Administered 2024-04-09: 2 g via INTRAVENOUS
  Filled 2024-04-09: qty 2000

## 2024-04-09 MED ORDER — MISOPROSTOL 50MCG HALF TABLET
50.0000 ug | ORAL_TABLET | ORAL | Status: DC | PRN
  Administered 2024-04-09: 50 ug via BUCCAL
  Filled 2024-04-09: qty 1

## 2024-04-09 MED ORDER — TRANEXAMIC ACID-NACL 1000-0.7 MG/100ML-% IV SOLN
INTRAVENOUS | Status: AC
  Administered 2024-04-09: 1000 mg
  Filled 2024-04-09: qty 100

## 2024-04-09 MED ORDER — NIFEDIPINE ER OSMOTIC RELEASE 30 MG PO TB24
30.0000 mg | ORAL_TABLET | Freq: Every day | ORAL | Status: DC
  Administered 2024-04-10 – 2024-04-11 (×2): 30 mg via ORAL
  Filled 2024-04-09 (×2): qty 1

## 2024-04-09 MED ORDER — GENTAMICIN SULFATE 40 MG/ML IJ SOLN
5.0000 mg/kg | Freq: Once | INTRAVENOUS | Status: DC
  Filled 2024-04-09: qty 19.25

## 2024-04-09 MED ORDER — DIPHENHYDRAMINE HCL 50 MG/ML IJ SOLN: 12.5000 mg | INTRAMUSCULAR | Status: DC | PRN

## 2024-04-09 MED ORDER — LIDOCAINE HCL (PF) 1 % IJ SOLN
INTRAMUSCULAR | Status: DC | PRN
  Administered 2024-04-09: 5 mL via EPIDURAL

## 2024-04-09 MED ORDER — FENTANYL-BUPIVACAINE-NACL 0.5-0.125-0.9 MG/250ML-% EP SOLN
12.0000 mL/h | EPIDURAL | Status: DC | PRN
  Administered 2024-04-09: 12 mL/h via EPIDURAL
  Filled 2024-04-09: qty 250

## 2024-04-09 MED ORDER — GENTAMICIN SULFATE 40 MG/ML IJ SOLN
5.0000 mg/kg | INTRAVENOUS | Status: DC
  Administered 2024-04-09: 770 mg via INTRAVENOUS
  Filled 2024-04-09: qty 19.25

## 2024-04-09 MED ORDER — FUROSEMIDE 20 MG PO TABS
20.0000 mg | ORAL_TABLET | Freq: Every day | ORAL | Status: DC
  Administered 2024-04-10 – 2024-04-11 (×2): 20 mg via ORAL
  Filled 2024-04-09 (×2): qty 1

## 2024-04-09 MED ORDER — PHENYLEPHRINE 80 MCG/ML (10ML) SYRINGE FOR IV PUSH (FOR BLOOD PRESSURE SUPPORT): 80.0000 ug | PREFILLED_SYRINGE | INTRAVENOUS | Status: DC | PRN

## 2024-04-09 MED ORDER — MISOPROSTOL 50MCG HALF TABLET
50.0000 ug | ORAL_TABLET | ORAL | Status: DC
  Administered 2024-04-09: 50 ug via VAGINAL
  Filled 2024-04-09 (×3): qty 1

## 2024-04-09 MED ORDER — AMMONIA AROMATIC IN INHA
RESPIRATORY_TRACT | Status: AC
  Filled 2024-04-09: qty 10

## 2024-04-09 MED ORDER — LACTATED RINGERS IV SOLN
500.0000 mL | Freq: Once | INTRAVENOUS | Status: AC
  Administered 2024-04-09: 500 mL via INTRAVENOUS

## 2024-04-09 MED ORDER — POTASSIUM CHLORIDE CRYS ER 20 MEQ PO TBCR
20.0000 meq | EXTENDED_RELEASE_TABLET | Freq: Every day | ORAL | Status: DC
  Administered 2024-04-10 – 2024-04-11 (×2): 20 meq via ORAL
  Filled 2024-04-09 (×2): qty 1

## 2024-04-09 NOTE — Anesthesia Preprocedure Evaluation (Signed)
 Anesthesia Evaluation  Patient identified by MRN, date of birth, ID band Patient awake    Reviewed: Allergy & Precautions, NPO status , Patient's Chart, lab work & pertinent test results  Airway Mallampati: III  TM Distance: >3 FB Neck ROM: Full    Dental no notable dental hx. (+) Teeth Intact, Dental Advisory Given   Pulmonary    Pulmonary exam normal breath sounds clear to auscultation       Cardiovascular hypertension, Normal cardiovascular exam Rhythm:Regular Rate:Normal     Neuro/Psych  Headaches  Anxiety Depression       GI/Hepatic Neg liver ROS,,,  Endo/Other    Class 3 obesity  Renal/GU      Musculoskeletal   Abdominal   Peds  Hematology Lab Results      Component                Value               Date                      WBC                      7.1                 04/09/2024                HGB                      13.3                04/09/2024                HCT                      40.0                04/09/2024                MCV                      87.5                04/09/2024                PLT                      284                 04/09/2024     Anesthesia Other Findings   Reproductive/Obstetrics (+) Pregnancy                              Anesthesia Physical Anesthesia Plan  ASA: 3  Anesthesia Plan: Epidural   Post-op Pain Management:    Induction:   PONV Risk Score and Plan:   Airway Management Planned:   Additional Equipment:   Intra-op Plan:   Post-operative Plan:   Informed Consent: I have reviewed the patients History and Physical, chart, labs and discussed the procedure including the risks, benefits and alternatives for the proposed anesthesia with the patient or authorized representative who has indicated his/her understanding and acceptance.       Plan Discussed with:   Anesthesia Plan Comments: (38.6 Wk IUFD primagravida w BMI 51 ,  DIC labs nl 0.9 INR)  Anesthesia Quick Evaluation

## 2024-04-09 NOTE — Anesthesia Procedure Notes (Signed)
 Epidural Patient location during procedure: OB Start time: 04/09/2024 11:53 AM End time: 04/09/2024 12:07 PM  Staffing Anesthesiologist: Jefm Garnette LABOR, MD Performed: anesthesiologist   Preanesthetic Checklist Completed: patient identified, IV checked, site marked, risks and benefits discussed, surgical consent, monitors and equipment checked, pre-op evaluation and timeout performed  Epidural Patient position: sitting Prep: DuraPrep and site prepped and draped Patient monitoring: continuous pulse ox and blood pressure Approach: midline Location: L3-L4 Injection technique: LOR air  Needle:  Needle type: Tuohy  Needle gauge: 17 G Needle length: 9 cm and 9 Needle insertion depth: 9 cm Catheter type: closed end flexible Catheter size: 19 Gauge Catheter at skin depth: 15 cm Test dose: negative  Assessment Events: blood not aspirated, no cerebrospinal fluid, injection not painful, no injection resistance, no paresthesia and negative IV test  Additional Notes Patient identified. Risks/Benefits/Options discussed with patient including but not limited to bleeding, infection, nerve damage, paralysis, failed block, incomplete pain control, headache, blood pressure changes, nausea, vomiting, reactions to medication both or allergic, itching and postpartum back pain. Confirmed with bedside nurse the patient's most recent platelet count. Confirmed with patient that they are not currently taking any anticoagulation, have any bleeding history or any family history of bleeding disorders. Patient expressed understanding and wished to proceed. All questions were answered. Sterile technique was used throughout the entire procedure. Please see nursing notes for vital signs. Test dose was given through epidural needle and negative prior to continuing to dose epidural or start infusion. Warning signs of high block given to the patient including shortness of breath, tingling/numbness in hands, complete  motor block, or any concerning symptoms with instructions to call for help. Patient was given instructions on fall risk and not to get out of bed. All questions and concerns addressed with instructions to call with any issues.  1 Attempt (S) . Patient tolerated procedure well.

## 2024-04-09 NOTE — Progress Notes (Signed)
 LABOR PROGRESS NOTE Approx 1400: in the room to introduce myself to the patient and family. FOB with many questions about interval from BPP to IUFD. RN SCE: 3.5/80/-1 Irreg ctx. Epidural in place. Pit started  Approx 1860: RN reports elevated temp and HA. Pt examined at bedside, HR tachycardia, resp CTA b/l axillae. Pt feels shaky. BP WNL. Tylenol  given for HA. Ddx: transition to active labor, gHTN, chorio iso IUFD and s/p ROM. Rx gent and amp.   Approx 1900: RN reports complete dilation. Abx not given yet. She will begin pushing with the patient.  Approx 2010: called to the room for imminent delivery. See separate delivery note for details.    Brandi GORMAN Maier, DO

## 2024-04-09 NOTE — Progress Notes (Signed)
 Pharmacy Antibiotic Note  Brandi Manning is a 28 y.o. female admitted on 04/08/2024 with Chorioamnionitis.  Pharmacy has been consulted for Gentamicin  dosing.  Plan: Start gentamicin  5mg /kg Q24h Continue ampicillin  2g Q6h per MD Monitor for resolution of fever  Height: 5' 8 (172.7 cm) Weight: (!) 153 kg (337 lb 4.8 oz) IBW/kg (Calculated) : 63.9  Temp (24hrs), Avg:98.4 F (36.9 C), Min:97.4 F (36.3 C), Max:100.2 F (37.9 C)  Recent Labs  Lab 04/09/24 0017 04/09/24 0844 04/09/24 0848  WBC 7.5  --  7.1  CREATININE 0.70 0.78  --     Estimated Creatinine Clearance: 163 mL/min (by C-G formula based on SCr of 0.78 mg/dL).    No Known Allergies  Antimicrobials this admission: Gentamicin  8/3 >>  Ampicillin  8/3 >>   Thank you for allowing pharmacy to be a part of this patient's care.  Arnaldo Lark 04/09/2024 6:19 PM

## 2024-04-09 NOTE — Discharge Summary (Signed)
 Postpartum Discharge Summary  Date of Service updated***     Patient Name: Brandi Manning DOB: 12/09/1995 MRN: [id number]  Date of admission: 04/08/2024 Delivery date:04/09/2024 Delivering provider: PAYNE, SHANTONETTE M Date of discharge: 04/09/2024  Admitting diagnosis: IUFD at 38/6.  Intrauterine pregnancy: [redacted]w[redacted]d     Secondary diagnosis: new GHTN. Chronic known mild polyhydramnios. BMI 50  Additional problems: elevated TSH    Discharge diagnosis: {DX.:23714}                                              Post partum procedures:{Postpartum procedures:23558} Augmentation: cytotec , pitocin  Complications: chorio close to delivery s/p one dose of amp  Hospital course: Patient admitted after presenting to [facility] triage with CC of decreased fetal movements. She was diagnosed with an IUFD and also had newly elevated BPs with work up consistent with GHTN. She had a good IOL with only complication of chorio close to delivery for which she received one dose of amp prior to delivering.  Lacerations:    Details of delivery can be found in separate delivery note.    Patient had a postpartum course complicated by***.  She was started on procardia  30 qday and kdur and lasix  on PPD#1 and did well on this ***  Patient is discharged home 04/09/24.  Newborn Data: Birth date:04/09/2024 Birth time:8:25 PM Gender:Female Living status:Fetal Demise Apgars:0 ,0  Weight:   Magnesium Sulfate received: {Mag received:30440022} BMZ received: No Rhophylac:N/A MMR:N/A Transfusion:{Transfusion received:30440034} Immunizations administered: Immunization History  Administered Date(s) Administered   DTP 03/08/1995, 05/12/1995, 07/14/1995   DTaP 03/08/1995, 05/12/1995, 07/14/1995, 09/22/1996, 11/21/1999   HIB (PRP-OMP) 03/08/1995, 05/12/1995, 07/14/1995, 09/22/1996   HIB, Unspecified 03/08/1995, 05/12/1995, 07/14/1995   HPV 9-valent 01/16/2016   HPV Quadrivalent 10/25/2013, 11/16/2014   Hepatitis B  22-Nov-1995, 03/08/1995, 07/14/1995   Hepatitis B, ADULT July 16, 1995, 03/08/1995, 07/14/1995   IPV 03/08/1995, 05/12/1995, 07/14/1995, 11/21/1999   MMR 12/30/1995, 01/04/1996, 11/21/1999   Meningococcal B, OMV 01/16/2016   Meningococcal Conjugate 10/25/2013   OPV 03/08/1995, 05/12/1995, 07/14/1995   PFIZER(Purple Top)SARS-COV-2 Vaccination 07/19/2020   PPD Test 06/19/2021   Tdap 02/16/2014, 02/16/2024   Varicella 01/04/1996, 10/25/2013    Physical exam  Vitals:   04/09/24 1700 04/09/24 1702 04/09/24 1731 04/09/24 1747  BP: (!) 128/98 125/72 133/67   Pulse: (!) 105 (!) 117 99   Resp: 16  16   Temp: 98.7 F (37.1 C)   100.2 F (37.9 C)  TempSrc: Oral   Axillary  SpO2:      Weight:      Height:       General: {Exam; general:21111117} Lochia: {Desc; appropriate/inappropriate:30686::appropriate} Uterine Fundus: {Desc; firm/soft:30687} Incision: {Exam; incision:21111123} DVT Evaluation: {Exam; dvt:2111122} Labs: Lab Results  Component Value Date   WBC 7.3 04/09/2024   HGB 13.4 04/09/2024   HCT 40.4 04/09/2024   MCV 87.8 04/09/2024   PLT 230 04/09/2024      Latest Ref Rng & Units 04/09/2024    8:44 AM  CMP  Glucose 70 - 99 mg/dL 79   BUN 6 - 20 mg/dL <5   Creatinine 9.55 - 1.00 mg/dL 9.21   Sodium 864 - 854 mmol/L 138   Potassium 3.5 - 5.1 mmol/L 4.1   Chloride 98 - 111 mmol/L 107   CO2 22 - 32 mmol/L 21   Calcium 8.9 - 10.3 mg/dL 9.2   Total Protein  6.5 - 8.1 g/dL 5.9   Total Bilirubin 0.0 - 1.2 mg/dL 0.3   Alkaline Phos 38 - 126 U/L 129   AST 15 - 41 U/L 20   ALT 0 - 44 U/L 10    Edinburgh Score:     No data to display            After visit meds:  Allergies as of 04/09/2024   No Known Allergies   Med Rec must be completed prior to using this [facility]***        Discharge home in stable condition Infant Feeding: {Baby feeding:23562} Infant Disposition:{CHL IP OB HOME WITH FNUYZM:76418} Discharge instruction: per After Visit Summary and  Postpartum booklet. Activity: Advance as tolerated. Pelvic rest for 6 weeks.  Diet: {OB diet:21111121} Anticipated Birth Control: {Birth Control:23956} Postpartum Appointment:{Outpatient follow up:23559} Additional Postpartum F/U: {PP Procedure:23957} Future Appointments: Future Appointments  Date Time Provider Department Center  05/03/2024 10:45 AM [facility] CLINICIAN [facility]   Follow up Visit: [X]  message sent to [facility] and jamie for 1wk bp and mood check     04/09/2024 Bebe Furry, MD

## 2024-04-09 NOTE — Progress Notes (Signed)
 I responded to a page from the nurse to provide spiritual support for the patient due to fetal demise. I arrived at the patient's room where many family members were present. I provided spiritual support through pastoral presence, by sharing words of comfort, and leading in prayer.    04/09/24 2340  Spiritual Encounters  Type of Visit Initial  Care provided to: Pt and family  Conversation partners present during encounter Nurse  Referral source Nurse (RN/NT/LPN)  Reason for visit Patient death  OnCall Visit Yes  Interventions  Spiritual Care Interventions Made Established relationship of care and support;Compassionate presence;Bereavement/grief support;Prayer;Supported grief process    Chaplain Dr Ozell Law

## 2024-04-09 NOTE — Progress Notes (Signed)
   04/08/24 1013  Spiritual Encounters  Type of Visit Initial  Care provided to: Pt and family  Referral source Clinical staff  Reason for visit Trauma  OnCall Visit Yes  Spiritual Framework  Presenting Themes Impactful experiences and emotions;Significant life change  Community/Connection Family  Needs/Challenges/Barriers Fetal loss/ grief  Patient Stress Factors Loss of control;Major life changes;Health changes  Family Stress Factors Exhausted;Major life changes;Health changes  Interventions  Spiritual Care Interventions Made Established relationship of care and support;Compassionate presence  Intervention Outcomes  Outcomes Awareness of support;Awareness of health   Chaplain was paged to provided support to a Pt and spouse experiencing fetal loss. Upon arrival the pt's spouse was present at bedside and appeared visibly distressed. The spouse shared feelings of grief and anger, expressing concern that the physician may have been at fault.  He stated the baby was reportedly healthy during their last prenatal visit and had reached approximately 8lbs. He question why delivery was not initiated at that time.  The couple reported that when the Pt.noticed decrease fetal movement , they came to [facility] were they was informed that the baby no longer had a heartbeat. They was previously told the baby would be delivered within the next two days.  The was the couples's first child, and they have been married only a few months. Their grief is compounded by the th hopes and expectation surround this first pregnancy and early season of marriage.  Chaplain provide a calm and compassionate presence, offering emotional and spiritual support. Chaplain listened to both Pt.and spouse, acknowledging their pain and loss. The Pt's pastor also visited and provided additional pastoral care.  Chaplain affirmed that spiritual care remains available as they continue to process this profound loss.

## 2024-04-10 ENCOUNTER — Encounter (HOSPITAL_COMMUNITY): Payer: Self-pay | Admitting: Family Medicine

## 2024-04-10 ENCOUNTER — Inpatient Hospital Stay (HOSPITAL_COMMUNITY)

## 2024-04-10 ENCOUNTER — Inpatient Hospital Stay (HOSPITAL_COMMUNITY): Admission: RE | Admit: 2024-04-10 | Payer: 59 | Source: Home / Self Care

## 2024-04-10 DIAGNOSIS — R39198 Other difficulties with micturition: Secondary | ICD-10-CM | POA: Diagnosis not present

## 2024-04-10 LAB — VARICELLA ZOSTER ANTIBODY, IGM: Varicella-Zoster Ab, IgM: <0.91 {index} (ref 0.00–0.90)

## 2024-04-10 LAB — CBC
HCT: 34.2 % — ABNORMAL LOW (ref 36.0–46.0)
Hemoglobin: 11.4 g/dL — ABNORMAL LOW (ref 12.0–15.0)
MCH: 28.9 pg (ref 26.0–34.0)
MCHC: 33.3 g/dL (ref 30.0–36.0)
MCV: 86.8 fL (ref 80.0–100.0)
Platelets: 211 K/uL (ref 150–400)
RBC: 3.94 MIL/uL (ref 3.87–5.11)
RDW: 13.5 % (ref 11.5–15.5)
WBC: 13.5 K/uL — ABNORMAL HIGH (ref 4.0–10.5)
nRBC: 0 % (ref 0.0–0.2)

## 2024-04-10 LAB — HSV(HERPES SIMPLEX VRS) I + II AB-IGG
HSV 1 Glycoprotein G Ab, IgG: NONREACTIVE
HSV 2 Glycoprotein G Ab, IgG: NONREACTIVE

## 2024-04-10 LAB — BETA-2-GLYCOPROTEIN I ABS, IGG/M/A
Beta-2 Glyco I IgG: <9 GPI IgG units (ref 0–20)
Beta-2-Glycoprotein I IgA: <9 GPI IgA units (ref 0–25)
Beta-2-Glycoprotein I IgM: <9 GPI IgM units (ref 0–32)

## 2024-04-10 LAB — PARVOVIRUS B19 ANTIBODY, IGG AND IGM
Parovirus B19 IgG Abs: 5.3 {index} — ABNORMAL HIGH (ref 0.0–0.8)
Parovirus B19 IgM Abs: 0.2 {index} (ref 0.0–0.8)

## 2024-04-10 LAB — HEMOGLOBIN A1C
Hgb A1c MFr Bld: 5.6 % (ref 4.8–5.6)
Mean Plasma Glucose: 114 mg/dL

## 2024-04-10 LAB — CARDIOLIPIN ANTIBODIES, IGG, IGM, IGA
Anticardiolipin IgA: <9 U/mL (ref 0–11)
Anticardiolipin IgG: <9 GPL U/mL (ref 0–14)
Anticardiolipin IgM: <9 [MPL'U]/mL (ref 0–12)

## 2024-04-10 MED ORDER — OXYCODONE HCL 5 MG PO TABS
5.0000 mg | ORAL_TABLET | Freq: Three times a day (TID) | ORAL | Status: DC | PRN
  Filled 2024-04-10: qty 1

## 2024-04-10 MED ORDER — SIMETHICONE 80 MG PO CHEW: 80.0000 mg | CHEWABLE_TABLET | ORAL | Status: DC | PRN

## 2024-04-10 MED ORDER — WITCH HAZEL-GLYCERIN EX PADS
1.0000 | MEDICATED_PAD | CUTANEOUS | Status: DC | PRN
  Administered 2024-04-10: 1 via TOPICAL

## 2024-04-10 MED ORDER — ONDANSETRON HCL 4 MG/2ML IJ SOLN: 4.0000 mg | INTRAMUSCULAR | Status: DC | PRN

## 2024-04-10 MED ORDER — LIDOCAINE HCL URETHRAL/MUCOSAL 2 % EX GEL
1.0000 | Freq: Once | CUTANEOUS | Status: AC
  Administered 2024-04-10: 1 via URETHRAL
  Filled 2024-04-10: qty 6

## 2024-04-10 MED ORDER — DIPHENHYDRAMINE HCL 25 MG PO CAPS: 25.0000 mg | ORAL_CAPSULE | Freq: Four times a day (QID) | ORAL | Status: DC | PRN

## 2024-04-10 MED ORDER — ONDANSETRON HCL 4 MG PO TABS: 4.0000 mg | ORAL_TABLET | ORAL | Status: DC | PRN

## 2024-04-10 MED ORDER — ACETAMINOPHEN 325 MG PO TABS
650.0000 mg | ORAL_TABLET | ORAL | Status: DC | PRN
  Administered 2024-04-10 (×2): 650 mg via ORAL
  Filled 2024-04-10 (×2): qty 2

## 2024-04-10 MED ORDER — TETANUS-DIPHTH-ACELL PERTUSSIS 5-2.5-18.5 LF-MCG/0.5 IM SUSY: 0.5000 mL | PREFILLED_SYRINGE | Freq: Once | INTRAMUSCULAR | Status: DC

## 2024-04-10 MED ORDER — OXYTOCIN-SODIUM CHLORIDE 30-0.9 UT/500ML-% IV SOLN: 2.5000 [IU]/h | INTRAVENOUS | Status: DC | PRN

## 2024-04-10 MED ORDER — DIBUCAINE (PERIANAL) 1 % EX OINT: 1.0000 | TOPICAL_OINTMENT | CUTANEOUS | Status: DC | PRN

## 2024-04-10 MED ORDER — BENZOCAINE-MENTHOL 20-0.5 % EX AERO
1.0000 | INHALATION_SPRAY | CUTANEOUS | Status: DC | PRN
  Administered 2024-04-10: 1 via TOPICAL
  Filled 2024-04-10: qty 56

## 2024-04-10 MED ORDER — COCONUT OIL OIL: 1.0000 | TOPICAL_OIL | Status: DC | PRN

## 2024-04-10 MED ORDER — IBUPROFEN 600 MG PO TABS
600.0000 mg | ORAL_TABLET | Freq: Four times a day (QID) | ORAL | Status: DC
  Administered 2024-04-10 – 2024-04-11 (×6): 600 mg via ORAL
  Filled 2024-04-10 (×7): qty 1

## 2024-04-10 MED ORDER — SENNOSIDES-DOCUSATE SODIUM 8.6-50 MG PO TABS: 2.0000 | ORAL_TABLET | Freq: Every evening | ORAL | Status: DC | PRN

## 2024-04-10 NOTE — Social Work (Signed)
 CSW received consult for fetal demise. CSW met with [name] to offer support and complete assessment.   CSW met with [name] at bedside and introduced role. CSW expressed her condolences, and the [name] appeared calm while lying in the bed with her baby bedside her in a cooling bassinet. The maternal grandmother was present at bedside. CSW offered [name] privacy, and [name] allowed CSW to share all information with maternal grandmother present. CSW allowed [name] space to express how she had been feelings. [name] expressed that her thoughts felt everywhere and mentioned that she was still trying to process her unexpected loss. CSW validated [name]'s feelings. She shared that she had went to her last prenatal care visit without any indications that something was wrong. [name] shared that her family has decided to have an autopsy completed to determine the cause of death and had planned to hold a service this weekend, followed by cremation.CSW acknowledged [name]'s wishes for her baby. CSW noted, [name]'s history of major depression and anxiety and inquired about her current treatment. [name] shared that she currently sees IBH counselor Karle), she had a visit last weeks and has an appointment in two weeks. [name] reported that she had taken medication in the past and is open to talking with her provider about restarting medication, if desired. CSW inquired about [name]'s support system during this time. [name] identified her husband, mom, brother and several other family members as sources of support. [name] shared that her pastor prayed with her prior to her delivery and has offered her support during difficult this time. CSW acknowledged the ongoing support from [name]'s faith community. CSW assessed [name] for safety. [name] denied having thoughts of harming herself and others.  CSW acknowledged [name]'s mental health history and discussed the grief and loss process and symptoms that she may experience. CSW provided [name] with resources for mental health treatment,  grief-loss counseling and support groups. CSW discussed mental health resources and offered emotional support. CSW recommended [name] complete self-evaluation for Perinatal Mental Health Discussion Tool for parents experiencing Loss and encouraged [name] to reach out to provider to discuss her symptoms if concerns are noted.  CSW assessed [name] for additional needs. [name] reported none. CSW encouraged [name] to inform medical staff if she needs to speak with social work, while she remains inpatient. CSW spoke with [facility] who stated that would also visit with [name] to offer services for Mothers that have experienced a loss.   Nat Quiet, MSW, LCSW Clinical Social Worker  [phone] 04/10/2024  1:56 PM

## 2024-04-10 NOTE — Progress Notes (Addendum)
 Foley placement attempted due to inability to void; in addition to perineum laceration, patient had decent peri-uretheral/peri-clitoral tear but this was hemostatic and not repaired. I told her I recommend indwelling placement due to likely edema causing inability to void.   37F foley attempt done but patient too uncomfortable for placement and difficult due to patient's body habitus  Will try topical analgesia and re-attempt. RN states she did a bladder scan with only 30-31mL noted. This is likely erroneous due to patient's body habitus  Bebe Izell Raddle MD Attending Center for [employer] (Faculty Practice) 04/10/2024 Time: [phone]

## 2024-04-10 NOTE — Anesthesia Postprocedure Evaluation (Signed)
 Anesthesia Post Note  Patient: Systems analyst  Procedure(s) Performed: AN AD HOC LABOR EPIDURAL     Patient location during evaluation: Mother Baby Anesthesia Type: Epidural Level of consciousness: awake and alert Pain management: pain level controlled Vital Signs Assessment: post-procedure vital signs reviewed and stable Respiratory status: spontaneous breathing, nonlabored ventilation and respiratory function stable Cardiovascular status: stable Postop Assessment: no headache, no backache and epidural receding Anesthetic complications: no   No notable events documented.  Last Vitals:  Vitals:   04/10/24 0300 04/10/24 0845  BP: 133/69 137/73  Pulse: 96 90  Resp: 16 17  Temp: 37.1 C 36.9 C  SpO2:  99%    Last Pain:  Vitals:   04/10/24 0845  TempSrc: Oral  PainSc:    Pain Goal:                   Saida Lonon

## 2024-04-10 NOTE — Consult Note (Signed)
   Outpatient Lactation Booking Note  Visited parent to establish outpatient lactation care and per MD request. OP lactation consultants to room to offer condolences and hold space for Meloni, and review options for managing future breast changes. Reviewed options on how to manage breast fullness if desiring to dry milk supply up and options to remove the breast milk if desiring to donate or utilize for [employer]. Encouraged to please contact outpatient lactation and we would be happy to support Annella's goals and answer any questions or concerns.    Geraldina Louder, [facility] Center for [facility]

## 2024-04-10 NOTE — Progress Notes (Signed)
 Foley placed w/o issue with UOP. Recommend leaving for at least 24h  Bebe Izell Raddle MD Attending Center for [employer] (Faculty Practice) 04/10/2024 Time: [phone]

## 2024-04-10 NOTE — Progress Notes (Signed)
 Spoke with patient and family at bedside at length. Support and condolences given for loss of baby girl [place]. Reviewed events of delivery. Reviewed ultrasound reports, copy was requested and provided to them (7/30 and 8/2 ultrasound reports).  Reviewed lab work so far is normal but a lot is still pending. Reviewed TSH slightly elevated but T4 within normal limits. Recommend repeating labs in 6 weeks. Patient plans autopsy.  They have concerns regarding their experience and communication while in the hospital. I have relayed these concerns to the charge nurse and the [facility] who I have asked to speak with the patient. We will also provide information for the patient to be in contact with the patient experience team. Will continue to follow up with the patient during her inpatient stay.  Rollo ONEIDA Bring, MD, FACOG Obstetrician & Gynecologist, [facility] for [facility]

## 2024-04-10 NOTE — Progress Notes (Signed)
 Per patient request Charge RN at bedside, Women's AC, and MD to bedside. Dr. Abigail and Dr. Jhonny met with patient at bedside regarding patient concerns. Patient requesting medical records and autopsy.

## 2024-04-10 NOTE — Progress Notes (Signed)
 Daily Post Partum Note  04/10/2024 Brandi Manning is a 28 y.o. G1P1000 PPD#1 s/p SVD/2nd degree at [redacted]w[redacted]d.  Pregnancy c/b IUFD, poly. New GHTN on L&D admit 24hr/overnight events:  Urinary retention. Foley just placed  Subjective:  Pain decently controlled. Lochia decreasing.   Objective:   Vitals:   04/10/24 0031 04/10/24 0046 04/10/24 0145 04/10/24 0300  BP: (!) 143/84  (!) 146/88 133/69  Pulse: (!) 105  (!) 114 96  Resp:   18 16  Temp:  99.8 F (37.7 C) 98.6 F (37 C) 98.8 F (37.1 C)  TempSrc:  Axillary Oral Oral  SpO2:      Weight:      Height:        Current Vital Signs 24h Vital Sign Ranges  T 98.8 F (37.1 C) Temp  Avg: 99.5 F (37.5 C)  Min: 98.1 F (36.7 C)  Max: 101.9 F (38.8 C)  BP 133/69 BP  Min: 109/58  Max: 163/75  HR 96 Pulse  Avg: 103.4  Min: 73  Max: 141  RR 16 Resp  Avg: 14.8  Min: 14  Max: 18  SaO2 100 %   SpO2  Avg: 100 %  Min: 100 %  Max: 100 %       24 Hour I/O Current Shift I/O  Time Ins Outs 08/03 0701 - 08/04 0700 In: 2028.1 [I.V.:1706.9] Out: 1849 [Urine:900] No intake/output data recorded.    General: NAD Abdomen: soft, nttp. obese.  Perineum: perineum well healed, foley just placed, clear UOP Skin:  Warm and dry.  Respiratory Normal respiratory effort Extremities: no c/c/e  Medications Current Facility-Administered Medications  Medication Dose Route Frequency Provider Last Rate Last Admin   acetaminophen  (TYLENOL ) tablet 650 mg  650 mg Oral Q4H PRN Izell Harari, MD   650 mg at 04/10/24 0214   benzocaine -Menthol  (DERMOPLAST) 20-0.5 % topical spray 1 Application  1 Application Topical PRN Izell Harari, MD   1 Application at 04/10/24 0600   coconut oil  1 Application Topical PRN Izell Harari, MD       witch hazel-glycerin  (TUCKS) pad 1 Application  1 Application Topical PRN Izell Harari, MD   1 Application at 04/10/24 [phone]   And   dibucaine (NUPERCAINAL) 1 % rectal ointment 1 Application  1 Application Rectal PRN  Izell Harari, MD       diphenhydrAMINE  (BENADRYL ) capsule 25 mg  25 mg Oral Q6H PRN Izell Harari, MD       furosemide  (LASIX ) tablet 20 mg  20 mg Oral Daily Izell Harari, MD       ibuprofen  (ADVIL ) tablet 600 mg  600 mg Oral Q6H Izell Harari, MD   600 mg at 04/10/24 9474   NIFEdipine  (PROCARDIA -XL/NIFEDICAL-XL) 24 hr tablet 30 mg  30 mg Oral Daily Izell Harari, MD       ondansetron  (ZOFRAN ) tablet 4 mg  4 mg Oral Q4H PRN Izell Harari, MD       Or   ondansetron  (ZOFRAN ) injection 4 mg  4 mg Intravenous Q4H PRN Izell Harari, MD       oxyCODONE  (Oxy IR/ROXICODONE ) immediate release tablet 5 mg  5 mg Oral Q8H PRN Izell Harari, MD       oxytocin  (PITOCIN ) IV infusion 30 units in NS 500 mL - Premix  2.5 Units/hr Intravenous Continuous PRN Izell Harari, MD       potassium chloride  SA (KLOR-CON  M) CR tablet 20 mEq  20 mEq Oral Daily Izell Harari, MD  senna-docusate (Senokot-S) tablet 2 tablet  2 tablet Oral QHS PRN Izell Harari, MD       simethicone  (MYLICON) chewable tablet 80 mg  80 mg Oral PRN Izell Harari, MD       Tdap ROCCO) injection 0.5 mL  0.5 mL Intramuscular Once Izell Harari, MD       tranexamic acid  (CYKLOKAPRON ) 1000MG /129mL IVPB             Labs:  Recent Labs  Lab 04/09/24 0848 04/09/24 1822 04/10/24 0412  WBC 7.1 7.3 13.5*  HGB 13.3 13.4 11.4*  HCT 40.0 40.4 34.2*  PLT 284 230 211   Recent Labs  Lab 04/09/24 0017 04/09/24 0844  NA 136 138  K 3.7 4.1  CL 109 107  CO2 18* 21*  BUN <5* <5*  CREATININE 0.70 0.78  GLUCOSE 102* 79  CALCIUM 9.5 9.2    Assessment & Plan:  Patient stable *Postpartum/postop: routine care. O pos. Will leave foley until tomorrow *GHTN: continue current regiment *Dispo: potentially tomorrow.   Harari Izell Overcast MD Attending Center for [facility])

## 2024-04-11 ENCOUNTER — Other Ambulatory Visit (HOSPITAL_COMMUNITY): Payer: Self-pay

## 2024-04-11 ENCOUNTER — Encounter (HOSPITAL_COMMUNITY): Payer: Self-pay | Admitting: Family Medicine

## 2024-04-11 LAB — CMV ANTIBODY, IGG (EIA): CMV Ab - IgG: <0.6 U/mL (ref 0.00–0.59)

## 2024-04-11 LAB — LUPUS ANTICOAGULANT PANEL
DRVVT: 40 s (ref 0.0–47.0)
PTT Lupus Anticoagulant: 34.4 s (ref 0.0–43.5)

## 2024-04-11 LAB — TOXOPLASMA ANTIBODIES- IGG AND  IGM
Toxoplasma Antibody- IgM: <3 [AU]/ml (ref 0.0–7.9)
Toxoplasma IgG Ratio: <3 [IU]/mL (ref 0.0–7.1)

## 2024-04-11 LAB — VARICELLA ZOSTER ANTIBODY, IGG: Varicella IgG: REACTIVE

## 2024-04-11 LAB — CMV IGM: CMV IgM: <30 [AU]/ml (ref 0.0–29.9)

## 2024-04-11 MED ORDER — NIFEDIPINE ER 30 MG PO TB24
30.0000 mg | ORAL_TABLET | Freq: Every day | ORAL | 0 refills | Status: DC
  Filled 2024-04-11: qty 30, 30d supply, fill #0

## 2024-04-11 MED ORDER — IBUPROFEN 600 MG PO TABS
600.0000 mg | ORAL_TABLET | Freq: Four times a day (QID) | ORAL | 0 refills | Status: DC
  Filled 2024-04-11: qty 30, 8d supply, fill #0

## 2024-04-11 MED ORDER — DOCUSATE SODIUM 100 MG PO CAPS
100.0000 mg | ORAL_CAPSULE | Freq: Two times a day (BID) | ORAL | 0 refills | Status: DC
  Filled 2024-04-11: qty 10, 5d supply, fill #0

## 2024-04-11 MED ORDER — ACETAMINOPHEN 325 MG PO TABS
650.0000 mg | ORAL_TABLET | ORAL | 0 refills | Status: DC | PRN
  Filled 2024-04-11: qty 30, 3d supply, fill #0

## 2024-04-11 MED ORDER — FUROSEMIDE 20 MG PO TABS
20.0000 mg | ORAL_TABLET | Freq: Every day | ORAL | 0 refills | Status: DC
  Filled 2024-04-11: qty 3, 3d supply, fill #0

## 2024-04-11 MED ORDER — POTASSIUM CHLORIDE CRYS ER 20 MEQ PO TBCR
20.0000 meq | EXTENDED_RELEASE_TABLET | Freq: Every day | ORAL | 0 refills | Status: DC
  Filled 2024-04-11: qty 3, 3d supply, fill #0

## 2024-04-11 NOTE — Social Work (Signed)
 CSW received consult due to score 17 on Edinburgh Depression Screen.   CSW acknowledged [name]'s Edinburgh score of 17. CSW met with [name] on 8/4 and provided mental health resources and encouraged [name] to notify a medical professional if symptoms arise. CSW met with [name] at bedside today to check in with her. FOB was also present. [name] appeared calm and stated that she was feeling okay and ready to go home. CSW reminded [name] about the mental health resources that CSW had provided. FOB expressed his frustration regarding his loss. CSW offered space for FOB to share his concerns and frustrations regarding his loss. CSW validated [name]'s feelings and encouraged FOB to utilize the mental health resources as well. CSW assessed parents for additional needs. Parents reported none.  Brandi Manning, MSW, LCSW Clinical Social Worker  [phone] 04/11/2024  12:34 PM

## 2024-04-11 NOTE — Progress Notes (Signed)
 Chaplain paged by KEN Iha and notified of pt's presence by [facility] after chaplain had been unable to locate pt on Monday due to typo in pt identifier.  Chaplain engaged [employer] and her husband Darilyn in open ended questions to facilitate emotional expression and story telling. Elijah shared his feelings of anger toward various members of the medical team and his grief that his daughter died despite some concern being raised last week at her [facility] and OB clinic appointments. Chaplain utilized reflective listening to identify challenges as well as sources of strength.  Elijah shared his frustration that Leinani's induction was not scheduled earlier and that she wasn't monitored more. Elijah also voiced frustration that the RN who was in the delivery room had to respond to a call on her vocera device and reportedly left the couple unattended while she was pushing. Elijah also expressed anger that he was told that the ultrasound machine does not save images when he asked for a picture. He reports that he saw them today with the doctor and realized he'd been lied to. Chaplain validated the challenges of this experience and the pain of losing their daughter as well as the deep desire to figure out what went wrong. Chaplain ensured that the Sabel's have the number to patient experience and encouraged them to schedule appointments with the doctors they saw last week to gain further understanding regarding the decision making differential. Chaplain also engaged in some reframing around the importance of prioritizing Analiya's health and future fertility after becoming aware of the the baby's demise thus making a c-section unlikely and offered to check in with ultrasound to provide education regarding printing pictures.   Chaplain was able to obtain the images from the MAU ultrasound from [facility] in ultrasound and also learned and conveyed that the family can contact medical records for a disc of their other images  and videos from the office appointments.   Chaplain also shared information regarding [facility] and the work they are doing with pregnancy loss and infant and maternal death and informed them that they may be contacted to provide information about their experience as they expressed concern about what choices would be different in subsequent pregnancies.   Shawan acknowledged feelings of guilt toward herself as she wonders how she might have changed things or advocated differently for a better outcome. Chaplain normalized these feelings as well as the powerlessness that comes with realizing you haven't done anything wrong and could not have prevented it.  Chaplain provided grief education and support. We discussed strategies for self care and management of expectations, reviewed PMADs vs grief and baby blues, and I updated Montasia's outpatient integrated [facility]h provider, [facility].  The couple is also aware of the hospital perinatal loss support group and that chaplain support remains available after discharge as well.  Please page as further needs arise.  Alan HERO. Davee Lomax, M.Div. [facility] Chaplain Pager [phone] Office [phone]

## 2024-04-13 ENCOUNTER — Encounter: Admitting: Family Medicine

## 2024-04-17 ENCOUNTER — Other Ambulatory Visit: Payer: Self-pay

## 2024-04-17 ENCOUNTER — Ambulatory Visit

## 2024-04-17 ENCOUNTER — Ambulatory Visit: Admitting: Family Medicine

## 2024-04-17 VITALS — BP 125/83 | HR 96 | Wt 305.0 lb

## 2024-04-17 DIAGNOSIS — Z3A38 38 weeks gestation of pregnancy: Secondary | ICD-10-CM

## 2024-04-17 DIAGNOSIS — O133 Gestational [pregnancy-induced] hypertension without significant proteinuria, third trimester: Secondary | ICD-10-CM

## 2024-04-17 DIAGNOSIS — F4321 Adjustment disorder with depressed mood: Secondary | ICD-10-CM | POA: Diagnosis not present

## 2024-04-17 DIAGNOSIS — O364XX0 Maternal care for intrauterine death, not applicable or unspecified: Secondary | ICD-10-CM

## 2024-04-17 NOTE — Progress Notes (Signed)
    Subjective:  Brandi Manning is a 28 y.o. female who presents to the clinic today for a mood check and blood pressure check after term IUFD at 38 weeks 5 days and new onset gestational hypertension  HPI:  Mood Patient presenting for mood check.  Patient had a term IUFD.  She was scheduled for induction of labor on 8/4 but felt no fetal movement on 8 2 so she went to the hospital.  Was found to have IUFD.  She was induced and delivered on 8/3.  Since delivery she and her partner have been grieving appropriately.  They have questions regarding if anything was missed and if anything was discovered from the placenta evaluation or if we had heard anything regarding the autopsy.  Patient reports she has been down and is scheduled to speak with our behavioral health specialist in a few weeks.  BP check Patient reports she has not been checking her blood pressures at home but has been taking her Procardia  daily and the Lasix  as well.  Reports occasional headaches which resolve spontaneously after she drinks some fluids and relaxes.  Denies any other symptoms.  Objective:  Physical Exam: BP 125/83   Pulse 96   Wt (!) 305 lb (138.3 kg)   LMP 07/12/2023   Breastfeeding No   BMI 46.38 kg/m   Gen: Alert, appropriate, no acute distress CV: Regular rate Pulm: Normal work of breathing, speaking in full sentences GI: Normal bowel sounds present. Soft, Nontender, Nondistended. Skin: warm, dry Neuro: grossly normal, moves all extremities Psych: Appropriate affect, depressed  No results found for this or any previous visit (from the past 72 hours).   Assessment/Plan:  Gestational hypertension Patient currently on Procardia  30 mg daily.  Blood pressure within normal limits today.  She is having occasional headaches which could be related to the Procardia .  Discussed discontinuation of Procardia  and starting another blood pressure medicine and revisiting the blood pressures but patient would like to  remain on this medicine and if headaches become a problem she will return for further evaluation.  Discussed return precautions.  Follow-up at postpartum visit.  IUFD at 20 weeks or more of gestation Pathology on placenta pending at this time.  Also awaiting results from autopsy.  Grieving appropriately.  Grief associated with loss of fetus Considerable grief for her father and mother which is appropriate.  She already sees our behavioral health specialist and is scheduled to see her in the next few weeks.  Discussed continued counseling and offered other resources including medications if needed.  Patient declines at this time and reports she will speak with Jamie about any concerns.  Offered our condolences and discussed that if there is anything she needs she can message me directly or message the clinic and they will get in contact with me.  Will follow-up at postpartum visit.   Lab Orders  No laboratory test(s) ordered today    No orders of the defined types were placed in this encounter.     Steffan Rover, MD Attending Family Medicine Physician, [facility] for [facility], [facility]   04/17/24 4:46 PM

## 2024-04-17 NOTE — Assessment & Plan Note (Signed)
 Pathology on placenta pending at this time.  Also awaiting results from autopsy.  Grieving appropriately.

## 2024-04-17 NOTE — Progress Notes (Signed)
 Nurse visit appointment reassigned to provider schedule for BP and Mood Check per Dr. Ilean.    Waddell, RN

## 2024-04-17 NOTE — Assessment & Plan Note (Signed)
 Patient currently on Procardia  30 mg daily.  Blood pressure within normal limits today.  She is having occasional headaches which could be related to the Procardia .  Discussed discontinuation of Procardia  and starting another blood pressure medicine and revisiting the blood pressures but patient would like to remain on this medicine and if headaches become a problem she will return for further evaluation.  Discussed return precautions.  Follow-up at postpartum visit.

## 2024-04-17 NOTE — Assessment & Plan Note (Signed)
 Considerable grief for her father and mother which is appropriate.  She already sees our behavioral health specialist and is scheduled to see her in the next few weeks.  Discussed continued counseling and offered other resources including medications if needed.  Patient declines at this time and reports she will speak with Jamie about any concerns.  Offered our condolences and discussed that if there is anything she needs she can message me directly or message the clinic and they will get in contact with me.  Will follow-up at postpartum visit.

## 2024-04-18 ENCOUNTER — Encounter: Payer: Self-pay | Admitting: Family Medicine

## 2024-04-18 NOTE — Telephone Encounter (Signed)
 Called patient to discuss results from pathology regarding placenta.  Unremarkable findings with no signs of abruption documented.  Also no signs of infection.

## 2024-04-20 ENCOUNTER — Inpatient Hospital Stay (HOSPITAL_COMMUNITY)
Admission: AD | Admit: 2024-04-20 | Discharge: 2024-04-20 | Disposition: A | Discharge status: Home / Self Care | Attending: Obstetrics and Gynecology | Admitting: Obstetrics and Gynecology

## 2024-04-20 ENCOUNTER — Other Ambulatory Visit: Payer: Self-pay

## 2024-04-20 ENCOUNTER — Encounter: Payer: Self-pay | Admitting: Family Medicine

## 2024-04-20 ENCOUNTER — Encounter (HOSPITAL_COMMUNITY): Payer: Self-pay | Admitting: Obstetrics and Gynecology

## 2024-04-20 ENCOUNTER — Inpatient Hospital Stay (HOSPITAL_COMMUNITY)

## 2024-04-20 ENCOUNTER — Encounter: Admitting: Family Medicine

## 2024-04-20 DIAGNOSIS — R102 Pelvic and perineal pain: Secondary | ICD-10-CM | POA: Diagnosis not present

## 2024-04-20 DIAGNOSIS — M549 Dorsalgia, unspecified: Secondary | ICD-10-CM | POA: Insufficient documentation

## 2024-04-20 DIAGNOSIS — N3001 Acute cystitis with hematuria: Secondary | ICD-10-CM | POA: Insufficient documentation

## 2024-04-20 DIAGNOSIS — Z634 Disappearance and death of family member: Secondary | ICD-10-CM | POA: Insufficient documentation

## 2024-04-20 DIAGNOSIS — O9089 Other complications of the puerperium, not elsewhere classified: Secondary | ICD-10-CM | POA: Diagnosis not present

## 2024-04-20 DIAGNOSIS — O99893 Other specified diseases and conditions complicating puerperium: Secondary | ICD-10-CM | POA: Insufficient documentation

## 2024-04-20 DIAGNOSIS — D259 Leiomyoma of uterus, unspecified: Secondary | ICD-10-CM | POA: Diagnosis not present

## 2024-04-20 LAB — WET PREP, GENITAL
Clue Cells Wet Prep HPF POC: NONE SEEN
Sperm: NONE SEEN
Trich, Wet Prep: NONE SEEN
WBC, Wet Prep HPF POC: >=10 — AB (ref <10)
Yeast Wet Prep HPF POC: NONE SEEN

## 2024-04-20 LAB — URINALYSIS, ROUTINE W REFLEX MICROSCOPIC
Bilirubin Urine: NEGATIVE
Glucose, UA: NEGATIVE mg/dL
Ketones, ur: NEGATIVE mg/dL
Nitrite: NEGATIVE
Protein, ur: NEGATIVE mg/dL
Specific Gravity, Urine: 1.006 (ref 1.005–1.030)
pH: 6 (ref 5.0–8.0)

## 2024-04-20 MED ORDER — KETOROLAC TROMETHAMINE 30 MG/ML IJ SOLN
30.0000 mg | Freq: Once | INTRAMUSCULAR | Status: AC
  Administered 2024-04-20: 30 mg via INTRAMUSCULAR
  Filled 2024-04-20: qty 1

## 2024-04-20 MED ORDER — NITROFURANTOIN MONOHYD MACRO 100 MG PO CAPS: 100.0000 mg | ORAL_CAPSULE | Freq: Two times a day (BID) | ORAL | 0 refills | Status: AC

## 2024-04-20 NOTE — MAU Note (Signed)
 Brandi Manning is a 28 y.o. at Unknown here in MAU reporting: SVD of demise with vaginal pain constant (denies intercourse)since Monday but got worse this morning, back spasms, and abd cramping. Pt reports bleeding is small but has an odor.  Complications with delivery- HBP with meds at DC LMP: na Onset of complaint:  Pain score: 9/10- vagina, 5/10 cramping and back pain Vitals:   04/20/24 0945  BP: 112/73  Pulse: 83  Resp: 18  Temp: 98.3 F (36.8 C)  SpO2: 99%     FHT:   Lab orders placed from triage: ua

## 2024-04-20 NOTE — MAU Provider Note (Signed)
 MAU Provider Note  Chief Complaint: Vaginal Pain and Abdominal Pain  SUBJECTIVE HPI: Brandi Manning is a 28 y.o. G1P1000 at postpartum day 13 after SVD of term IUFD, who presents to maternity admissions reporting worsening vaginal and back pain with radiation to groin, with moderate lochia.   Denies fever, chills, foul smelling discharge, abdominal pain. Pregnancy c/b 38 week IUFD, gestational hypertension, greif.   Receives [facility] with [facility].  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   Dysuria 07/29/2016   Encounter for contraceptive management 01/08/2017   Encounter for health maintenance examination in adult 01/16/2016   Goiter 11/16/2014   Headache    intermittent   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   Hypertriglyceridemia 06/28/2020   Moderate episode of recurrent major depressive disorder (HCC) 01/15/2023   Morbid obesity (HCC) 11/16/2014   Morning headache 03/19/2017   Relationship problem with parent 04/17/2016   Screening for cervical cancer 01/16/2016   Skin lesion 01/08/2017   Snoring 03/19/2017   Vaginal irritation 07/29/2016   Wears glasses    Past Surgical History:  Procedure Laterality Date   WISDOM TOOTH EXTRACTION     Social History   Socioeconomic History   Marital status: Married    Spouse name: Not on file   Number of children: Not on file   Years of education: Not on file   Highest education level: Not on file  Occupational History   Not on file  Tobacco Use   Smoking status: Never   Smokeless tobacco: Never  Vaping Use   Vaping status: Never Used  Substance and Sexual Activity   Alcohol use: Not Currently    Comment: occ   Drug use: No   Sexual activity: Yes    Birth control/protection: None  Other Topics Concern   Not on file  Social History Narrative   Went to [employer].  Junior at [employer] and wants to major in elementary education.   Exercise - sometimes, walking, diet is poor.   In the environmental club.  No significant other as of 01/2016.   Social Drivers of Corporate investment banker Strain: Not on file  Food Insecurity: No Food Insecurity (04/09/2024)   Hunger Vital Sign    Worried About Running Out of Food in the Last Year: Never true    Ran Out of Food in the Last Year: Never true  Transportation Needs: No Transportation Needs (04/09/2024)   PRAPARE - Administrator, Civil Service (Medical): No    Lack of Transportation (Non-Medical): No  Physical Activity: Not on file  Stress: Not on file  Social Connections: Not on file  Intimate Partner Violence: Not At Risk (04/09/2024)   Humiliation, Afraid, Rape, and Kick questionnaire    Fear of Current or Ex-Partner: No    Emotionally Abused: No    Physically Abused: No    Sexually Abused: No   No current facility-administered medications on file prior to encounter.   Current Outpatient Medications on File Prior to Encounter  Medication Sig Dispense Refill   acetaminophen  (TYLENOL ) 325 MG tablet Take 2 tablets (650 mg total) by mouth every 4 (four) hours as needed (for pain scale < 4). 30 tablet 0   ibuprofen  (ADVIL ) 600 MG tablet Take 1 tablet (600 mg total) by mouth every 6 (six) hours. 30 tablet 0  NIFEdipine  (ADALAT  CC) 30 MG 24 hr tablet Take 1 tablet (30 mg total) by mouth daily. 30 tablet 0   docusate sodium  (COLACE) 100 MG capsule Take 1 capsule (100 mg total) by mouth 2 (two) times daily. 10 capsule 0   furosemide  (LASIX ) 20 MG tablet Take 1 tablet (20 mg total) by mouth daily for 3 days. 3 tablet 0   potassium chloride  SA (KLOR-CON  M) 20 MEQ tablet Take 1 tablet (20 mEq total) by mouth daily for 3 days. 3 tablet 0   Prenatal Vit-Fe Fumarate-FA (MULTIVITAMIN-PRENATAL) 27-0.8 MG TABS tablet Take 1 tablet by mouth daily at 12 noon.     No Known Allergies  ROS:  Pertinent  positives/negatives listed above.  I have reviewed patient's Past Medical Hx, Surgical Hx, Family Hx, Social Hx, medications and allergies.   Physical Exam  Patient Vitals for the past 24 hrs:  BP Temp Temp src Pulse Resp SpO2 Height Weight  04/20/24 0945 112/73 98.3 F (36.8 C) Oral 83 18 99 % 5' 8 (1.727 m) (!) 136.7 kg   Constitutional: Well-developed, well-nourished female in no acute distress  Cardiovascular: normal rate Respiratory: normal effort GI: Abd soft, non-tender MS: Extremities nontender, no edema, normal ROM Neurologic: Alert and oriented x 4  GU: Neg CVAT.  PELVIC EXAM: Cervix pink, visually 2-3 cm, without lesion, small dark bloody mucoid discharge, vaginal walls and external genitalia normal. 2nd degree laceration repair well approximated. No hematoma. No fluctuate masses.   LAB RESULTS Results for orders placed or performed during the hospital encounter of 04/20/24 (from the past 24 hours)  Urinalysis, Routine w reflex microscopic -Urine, Clean Catch     Status: Abnormal   Collection Time: 04/20/24 10:51 AM  Result Value Ref Range   Color, Urine YELLOW YELLOW   APPearance CLEAR CLEAR   Specific Gravity, Urine 1.006 1.005 - 1.030   pH 6.0 5.0 - 8.0   Glucose, UA NEGATIVE NEGATIVE mg/dL   Hgb urine dipstick LARGE (A) NEGATIVE   Bilirubin Urine NEGATIVE NEGATIVE   Ketones, ur NEGATIVE NEGATIVE mg/dL   Protein, ur NEGATIVE NEGATIVE mg/dL   Nitrite NEGATIVE NEGATIVE   Leukocytes,Ua LARGE (A) NEGATIVE   RBC / HPF 0-5 0 - 5 RBC/hpf   WBC, UA 21-50 0 - 5 WBC/hpf   Bacteria, UA RARE (A) NONE SEEN   Squamous Epithelial / HPF 0-5 0 - 5 /HPF  Wet prep, genital     Status: Abnormal   Collection Time: 04/20/24 10:51 AM   Specimen: PATH Cytology Cervicovaginal Ancillary Only  Result Value Ref Range   Yeast Wet Prep HPF POC NONE SEEN NONE SEEN   Trich, Wet Prep NONE SEEN NONE SEEN   Clue Cells Wet Prep HPF POC NONE SEEN NONE SEEN   WBC, Wet Prep HPF POC >=10 (A)  <10   Sperm NONE SEEN     --/--/O POS Performed at [facility], [address] [place], [place] [address]  [id number])  IMAGING Narrative & Impression  CLINICAL DATA:  Pelvic pain after fetal demise.   EXAM: TRANSABDOMINAL ULTRASOUND OF PELVIS   TECHNIQUE: Transabdominal ultrasound examination of the pelvis was performed including evaluation of the uterus, ovaries, adnexal regions, and pelvic cul-de-sac.   COMPARISON:  April 08, 2024.   FINDINGS: Uterus   Measurements: 15.1 x 9.9 x 7.2 cm = volume: 560 mL. 2.4 cm fibroid noted in fundus.   Endometrium   Thickness: 31 mm. Heterogeneous material is noted within endometrium consistent  with postpartum status. Doppler does not demonstrate any vascularity in this region and therefore there is no definite evidence of retained products of conception.   Right ovary   Measurements: 2.4 x 2.0 x 2.1 cm = volume: 5 mL. Normal appearance/no adnexal mass.   Left ovary   Measurements: 2.4 x 2.0 x 1.9 cm = volume: 5 mL. Normal appearance/no adnexal mass.   Other findings:  No abnormal free fluid.   IMPRESSION: No definite evidence of retained products of conception seen in this postpartum uterus.     Electronically Signed   By: Lynwood Landy Raddle M.D.   On: 04/20/2024 11:43    MAU Management/MDM: Orders Placed This Encounter  Procedures   Wet prep, genital   US  PELVIS (TRANSABDOMINAL ONLY)   Urinalysis, Routine w reflex microscopic -Urine, Clean Catch   Discharge patient Discharge disposition: 01-Home or Self Care; Discharge patient date: 04/20/2024    Meds ordered this encounter  Medications   ketorolac  (TORADOL ) 30 MG/ML injection 30 mg   nitrofurantoin , macrocrystal-monohydrate, (MACROBID ) 100 MG capsule    Sig: Take 1 capsule (100 mg total) by mouth 2 (two) times daily for 5 days.    Dispense:  10 capsule    Refill:  0   Available prenatal records reviewed.  ASSESSMENT 1. Postpartum state   2.  Acute cystitis with hematuria    Wet prep negative GC/Chlamydia collected Pelvic US  for retained POC ordered - negative Toradol  IM for pain  Macrobid  for UTI  Itching secondary to vaginal heeling and suture from 2nd degree laceration  PLAN Discharge home with strict return precautions. Allergies as of 04/20/2024   No Known Allergies      Medication List     TAKE these medications    acetaminophen  325 MG tablet Commonly known as: Tylenol  Take 2 tablets (650 mg total) by mouth every 4 (four) hours as needed (for pain scale < 4).   docusate sodium  100 MG capsule Commonly known as: Colace Take 1 capsule (100 mg total) by mouth 2 (two) times daily.   furosemide  20 MG tablet Commonly known as: LASIX  Take 1 tablet (20 mg total) by mouth daily for 3 days.   ibuprofen  600 MG tablet Commonly known as: ADVIL  Take 1 tablet (600 mg total) by mouth every 6 (six) hours.   multivitamin-prenatal 27-0.8 MG Tabs tablet Take 1 tablet by mouth daily at 12 noon.   NIFEdipine  30 MG 24 hr tablet Commonly known as: ADALAT  CC Take 1 tablet (30 mg total) by mouth daily.   nitrofurantoin  (macrocrystal-monohydrate) 100 MG capsule Commonly known as: MACROBID  Take 1 capsule (100 mg total) by mouth 2 (two) times daily for 5 days.   potassium chloride  SA 20 MEQ tablet Commonly known as: KLOR-CON  M Take 1 tablet (20 mEq total) by mouth daily for 3 days.         Mardy Shropshire, MD FMOB Fellow, Faculty practice [facility], Center for [facility]  04/20/2024  12:27 PM

## 2024-04-21 ENCOUNTER — Ambulatory Visit: Payer: Self-pay | Admitting: Obstetrics and Gynecology

## 2024-04-21 LAB — GC/CHLAMYDIA PROBE AMP (~~LOC~~) NOT AT ARMC
Chlamydia: NEGATIVE
Comment: NEGATIVE
Comment: NORMAL
Neisseria Gonorrhea: NEGATIVE

## 2024-04-24 ENCOUNTER — Telehealth (HOSPITAL_COMMUNITY): Payer: Self-pay | Admitting: *Deleted

## 2024-04-24 NOTE — Telephone Encounter (Signed)
 04/24/2024  Name: Brandi Manning MRN: [id number] DOB: Nov 28, 1995  Reason for Call:  Transition of Care Hospital Discharge Call  Contact Status: Patient Contact Status: Complete  Language assistant needed: Interpreter Mode: Interpreter Not Needed        Follow-Up Questions: Do You Have Any Concerns About Your Health As You Heal From Delivery?: Yes What Concerns Do You Have About Your Health?: Reports that she had a gush of blood with stringy things in it yesterday. Wonders if part of her stitches came out. She notes some stinging with urination. She is taking the antibiotics for UTI prescribed by MAU provider on 8/14. Says her pain has decreased, but is still present. Encouraged patient to call provider and ask if she should be seen regarding the above symptoms. Do You Have Any Concerns About Your Infants Health?:  (IUFD)  Edinburgh Postnatal Depression Scale:  In the Past 7 Days:    PHQ2-9 Depression Scale:     Discharge Follow-up: Edinburgh score requires follow up?:  (did not complete, patient able to express feelings of grief, has [facility] appt 05/03/2024) Patient was advised of the following resources:: Bereavement (Grief support for pregnancy, infant and child loss)  Post-discharge interventions: NA  Mliss Sieve, RN 04/24/2024 12:25

## 2024-04-27 ENCOUNTER — Telehealth: Payer: Self-pay | Admitting: *Deleted

## 2024-04-27 NOTE — Telephone Encounter (Signed)
 Joen, from [facility], called the nurse line and left a message stating that she visited this patient at her home yesterday, 04/26/24.  She stated that the patient reported to her that she stopped taking her Nifedipine  one week ago due to experiencing headaches whenever she took the medication.  Her blood pressure for Joen was 130/86, with no symptoms.  Patient is scheduled for a postpartum appointment 05/11/2024 and Joen is aware.  She does not require a call back unless we have questions; she just wanted to let us  know that the patient had stopped taking her Nifedipine .

## 2024-05-01 ENCOUNTER — Telehealth: Payer: Self-pay | Admitting: Family Medicine

## 2024-05-01 NOTE — Telephone Encounter (Signed)
 Spoke with patient regarding placental pathology. She requested further evaluation due to findings of narrowing and folding of the cord. Spoke with pathology who will have it sent.

## 2024-05-02 NOTE — Telephone Encounter (Signed)
 Called patient and discussed ultrasound results from 7/30 and 8/2. Informed them that I spoke with pathology yesterday and that they are planning on sending placenta for further evaluation and updated patient that the autopsy may take up to 1 month.

## 2024-05-03 ENCOUNTER — Encounter

## 2024-05-09 ENCOUNTER — Encounter: Payer: Self-pay | Admitting: Family Medicine

## 2024-05-11 ENCOUNTER — Ambulatory Visit: Admitting: Family Medicine

## 2024-05-11 ENCOUNTER — Encounter: Payer: Self-pay | Admitting: Family Medicine

## 2024-05-11 DIAGNOSIS — F4321 Adjustment disorder with depressed mood: Secondary | ICD-10-CM

## 2024-05-11 DIAGNOSIS — O364XX0 Maternal care for intrauterine death, not applicable or unspecified: Secondary | ICD-10-CM

## 2024-05-11 DIAGNOSIS — K5900 Constipation, unspecified: Secondary | ICD-10-CM | POA: Diagnosis not present

## 2024-05-11 DIAGNOSIS — R7989 Other specified abnormal findings of blood chemistry: Secondary | ICD-10-CM | POA: Diagnosis not present

## 2024-05-11 DIAGNOSIS — O139 Gestational [pregnancy-induced] hypertension without significant proteinuria, unspecified trimester: Secondary | ICD-10-CM

## 2024-05-11 MED ORDER — LIDOCAINE HCL URETHRAL/MUCOSAL 2 % EX PRSY: 1.0000 | PREFILLED_SYRINGE | CUTANEOUS | 2 refills | Status: DC | PRN

## 2024-05-11 MED ORDER — POLYETHYLENE GLYCOL 3350 17 GM/SCOOP PO POWD: 17.0000 g | Freq: Every day | ORAL | 1 refills | Status: DC | PRN

## 2024-05-11 NOTE — Progress Notes (Unsigned)
 Post Partum Visit Note  Virgen Alberson is a 28 y.o. G46P1000 female who presents for a postpartum visit. She is 4 weeks postpartum following a normal spontaneous vaginal delivery.  I have fully reviewed the prenatal and intrapartum course. The delivery was at [redacted]w[redacted]d gestational weeks.  Anesthesia: epidural. Postpartum course has been okay. Baby was an IUFD. Bleeding no vaginal bleeding, but bleeding from hemorrhoid. Bowel function is constiption. Bladder function is normal. Patient is not sexually active. Contraception method is none. Postpartum depression screening: positive.   The pregnancy intention screening data noted above was reviewed. Potential methods of contraception were discussed. The patient elected to proceed with No data recorded.   Edinburgh Postnatal Depression Scale - 05/11/24 0951       Edinburgh Postnatal Depression Scale:  In the Past 7 Days   I have been able to laugh and see the funny side of things. 2    I have looked forward with enjoyment to things. 2    I have blamed myself unnecessarily when things went wrong. 3    I have been anxious or worried for no good reason. 3    I have felt scared or panicky for no good reason. 3    Things have been getting on top of me. 3    I have been so unhappy that I have had difficulty sleeping. 2    I have felt sad or miserable. 3    I have been so unhappy that I have been crying. 3    The thought of harming myself has occurred to me. 2    Edinburgh Postnatal Depression Scale Total 26          Health Maintenance Due  Topic Date Due   Meningococcal B Vaccine (2 of 2 - Bexsero SCDM 2-dose series) 07/18/2016   Influenza Vaccine  Never done   COVID-19 Vaccine (2 - 2025-26 season) 05/08/2024    The following portions of the patient's history were reviewed and updated as appropriate: allergies, current medications, past family history, past medical history, past social history, past surgical history, and problem  list.  Review of Systems Pertinent items noted in HPI and remainder of comprehensive ROS otherwise negative.  Objective:  BP (!) 130/91   Pulse 93   Wt (!) 302 lb (137 kg)   LMP 07/12/2023   BMI 45.92 kg/m    General:  alert, cooperative, and appears stated age   Breasts:  not indicated  Lungs: Comfortalbe on room air  Wound N/a  GU exam:  Perineum still in process of healing, some vircyl knots seen        Assessment:   Postpartum care and examination  IUFD at 20 weeks or more of gestation  Grief associated with loss of fetus  Gestational hypertension, antepartum  Constipation, unspecified constipation type - Plan: polyethylene glycol powder (GLYCOLAX /MIRALAX ) 17 GM/SCOOP powder, lidocaine  (XYLOCAINE ) 2 % jelly  Elevated TSH - Plan: TSH Rfx on Abnormal to Free T4  Abnormal postpartum exam.   Plan:   Essential components of care per ACOG recommendations:  1.  Mood and well being: Patient with positive depression screening today.  Discussed with patient, Van question 10 scored a 2 Patient reports passive SI, denies any specific plan We discussed active SI would be a very different situation and reviewed in detail different emergency options available to her, including MAU, main ED if too painful to go [facility], [facility] across the street from clinic Reports  she has been speaking with pastors at her church and she finds this helpful [facility] referral, she declined - Patient tobacco use? No.   - hx of drug use? No.    2. Infant care and feeding: n/a  3. Sexuality, contraception and birth spacing Not addressed at this visit  4. Sleep and fatigue Not discussed  5. Physical Recovery  - Discussed patients delivery and complications. - Patient had a Vaginal problems after delivery including shoulder dystocia. Patient had a 2nd degree laceration. Perineal healing reviewed.  Some knots visible but I'm worried that cutting them would cause issues  as perineum is still healing. Recommended f/u in two weeks to see how things are and if fully healed and knots still present they can be removed at that time.  - Patient has urinary incontinence? No. - Patient is safe to resume physical and sexual activity  6.  Health Maintenance - HM due items addressed No - up to date - Last pap smear  Diagnosis  Date Value Ref Range Status  09/28/2023   Final   - Negative for intraepithelial lesion or malignancy (NILM)   Pap smear not done at today's visit.  -Breast Cancer screening indicated? No.   7. Chronic Disease/Pregnancy Condition follow up:  1. Postpartum care and examination   2. IUFD at 20 weeks or more of gestation   3. Grief associated with loss of fetus   4. Gestational hypertension, antepartum   5. Constipation, unspecified constipation type   6. Elevated TSH    2.  Long discussion with C'krete and her partner Darilyn Many questions about what happened, around antenatal testing rhythm Elijah understandably very upset and repeatedly told me that he felt that someone had dropped the ball in the care of C'krete and their baby We reviewed antenatal testing algorithm in detail. I also offered to review ultrasound images personally to ensure the last BPP performed was 8/8, which it was He also asked about the possibility of the cord being around the baby's neck as a possible explanation for the IUFD. We discussed this is common and it is impossible to know if this was the cause. He asked me to look at the US  images to see if nuchal cord was seen, there is a suggestion of cord near the babies head in one image taken for AFI measurement but no definitive views, which I reviewed with him. He also asked about the +Parvovirus IgG test, we reviewed differences between IgG and IgM antibodies and that based on the ratios this is unlikely to be related He asked why more testing had not been done prenatally, for everything, including parvovirus Reviewed  that even if we had tested for parvovirus we do not have a vaccine or any sort of intervention for it, and for the same reason the tests that are done in pregnancy are focused on issues in which we are able to intervene if they are identified early At their request we discussed possible etiologies, and I told them that my best guess given the reassuring antenatal testing is that there was a cord event of some kind, which is not predictable in any way. I discussed that this is more akin to lightning strike or a car crash--if a cord event is the cause (and we don't know for sure that it was) that it is not predictable or preventable Patient and partner with many questions regarding future pregnancies as well. Discussed antenatal testing would begin early and that delivery could  be considered as early as 37 weeks They also inquired after autopsy report. Per my discussions with Dr. Ilean this was [facility] a [facility] and the time frame is unknown. I sent the pathologist a message and will follow up with the family once I hear back They also report what sounds like ANORA testing being performed--no results yet available but this is not uncommon as it can take many weeks, but I will follow up with [employer] rep  3. See above  4. Borderline elevation, no longer on Nifedipine  due to headaches, recheck at next visit  5. Discussed strategies in detail, topical lidocaine  and miralax  sent  6. Mildly increased at time of delivery, recheck TSH and free T4  - PCP follow up   Donnice CHRISTELLA Carolus, MD/MPH Attending Family Medicine Physician, [facility] for [facility], [facility]

## 2024-05-12 ENCOUNTER — Encounter: Payer: Self-pay | Admitting: Family Medicine

## 2024-05-15 DIAGNOSIS — Z0289 Encounter for other administrative examinations: Secondary | ICD-10-CM

## 2024-05-16 ENCOUNTER — Encounter: Payer: Self-pay | Admitting: Family Medicine

## 2024-05-24 ENCOUNTER — Other Ambulatory Visit (HOSPITAL_COMMUNITY)
Admission: RE | Admit: 2024-05-24 | Discharge: 2024-05-24 | Disposition: A | Discharge status: Home / Self Care | Source: Ambulatory Visit | Attending: Family Medicine | Admitting: Family Medicine

## 2024-05-24 ENCOUNTER — Other Ambulatory Visit: Payer: Self-pay

## 2024-05-24 ENCOUNTER — Ambulatory Visit: Admitting: Family Medicine

## 2024-05-24 VITALS — BP 119/87 | HR 86 | Wt 303.6 lb

## 2024-05-24 DIAGNOSIS — F4321 Adjustment disorder with depressed mood: Secondary | ICD-10-CM

## 2024-05-24 DIAGNOSIS — N898 Other specified noninflammatory disorders of vagina: Secondary | ICD-10-CM | POA: Insufficient documentation

## 2024-05-24 DIAGNOSIS — R7989 Other specified abnormal findings of blood chemistry: Secondary | ICD-10-CM

## 2024-05-24 NOTE — Assessment & Plan Note (Signed)
 Likely yeast infection but wet prep collected to confirm and ensure no other infections.  Will treat as needed.  If continued issues recommend reevaluation.

## 2024-05-24 NOTE — Assessment & Plan Note (Signed)
 Patient had discussion with Cornell Finder.  See separate note

## 2024-05-24 NOTE — Progress Notes (Signed)
    Subjective:  Brandi Manning is a 28 y.o. female who presents to the clinic today for follow-up vaginal exam  HPI: Patient presenting for follow-up after last visit.  She needs thyroid  labs repeated due to her elevated TSH previously noted during pregnancy.  She has also been having considerable vaginal irritation with discharge and bleeding.  Reports that she noted bleeding last week and figured it may be her period but it is extremely painful when she urinates as well.  Objective:  Physical Exam: BP 119/87   Pulse 86   Wt (!) 303 lb 9.6 oz (137.7 kg)   BMI 46.16 kg/m   Gen: Alert, appropriate CV: Regular rate Pulm: Normal work of breathing, speaking in full sentences GU: Thick whitish-yellow discharge with blood staining.  Perineum continue to heal but considerable erythema noted in vaginal tissue with satellite lesions and white patches around introitus Skin: warm, dry Neuro: grossly normal, moves all extremities Psych: Normal affect and thought content  No results found for this or any previous visit (from the past 72 hours).   Assessment/Plan:  Grief associated with loss of fetus Patient had discussion with Cornell Finder.  See separate note  Vaginal discharge Likely yeast infection but wet prep collected to confirm and ensure no other infections.  Will treat as needed.  If continued issues recommend reevaluation.  Elevated TSH Elevated TSH with normal T4 in pregnancy.  Collect thyroid  labs today.  Vitamin D  also collected.   Lab Orders         TSH + free T4         Vitamin D  (25 hydroxy)         T3, free     No orders of the defined types were placed in this encounter.    Steffan Rover, MD Attending Family Medicine Physician, [facility] for [facility], [facility]   05/24/24 1:36 PM

## 2024-05-24 NOTE — Assessment & Plan Note (Signed)
 Elevated TSH with normal T4 in pregnancy.  Collect thyroid  labs today.  Vitamin D  also collected.

## 2024-05-24 NOTE — BH Specialist Note (Signed)
 Integrated Behavioral Health via Telemedicine Visit  06/05/2024 Brandi Manning [id number]  Number of Integrated Behavioral Health Clinician visits: 2- Second Visit  Session Start time: 1316   Session End time: 1411  Total time in minutes: 55  Referring Provider: Norleen Rover, MD Patient/Family location: Home [facility] Provider location: Center for [facility]  All persons participating in visit: Patient Brandi Manning and [facility] Corretta Munce   Types of Service: Individual psychotherapy and Video visit  I connected with Havyn Subramaniam and/or Egan Rembert's n/a via  Telephone or Engineer, civil (consulting)  (Video is Caregility application) and verified that I am speaking with the correct person using two identifiers. Discussed confidentiality: Yes   I discussed the limitations of telemedicine and the availability of in person appointments.  Discussed there is a possibility of technology failure and discussed alternative modes of communication if that failure occurs.  I discussed that engaging in this telemedicine visit, they consent to the provision of behavioral healthcare and the services will be billed under their insurance.  Patient and/or legal guardian expressed understanding and consented to Telemedicine visit: Yes   Presenting Concerns: Patient and/or family reports the following symptoms/concerns: Grieving the loss of her daughter to stillbirth, complicated by increased depression, anxiety, insomnia and poor appetite.  Duration of problem: Ongoing depression/anxiety; increase with current grief; Severity of problem: severe  Patient and/or Family's Strengths/Protective Factors: Social connections, Concrete supports in place (healthy food, safe environments, etc.), and Sense of purpose  Goals Addressed: Patient will:  Reduce symptoms of: anxiety, depression, and insomnia   Increase knowledge and/or ability of: healthy habits    Demonstrate ability to: Increase healthy adjustment to current life circumstances, Increase adequate support systems for patient/family, and Begin healthy grieving over loss  Progress towards Goals: Ongoing    Interventions: Interventions utilized:  Psychoeducation and/or Health Education, Link to [employer], and Supportive Reflection Standardized Assessments completed: GAD-7 and PHQ 9    Patient and/or Family Response: Patient agrees with treatment plan.   Clinical Assessment/Diagnosis  Grief  Generalized anxiety disorder  Severe episode of recurrent major depressive disorder, without psychotic features (HCC)   Patient may benefit from continued therapeutic intervention.  Plan: Follow up with behavioral health clinician on : One month Behavioral recommendations:  -Continue allowing time and space to grieve loss of daughter, as discussed -Begin prioritizing healthy self-care (regular meals, adequate rest; allowing practical help from supportive friends and family)  -Consider pregnancy loss support group as needed at either [email or web address] or [email or web address]  -Discuss resuming BH medication at upcoming medical appointment Referral(s): Integrated Art gallery manager (In Clinic) and [employer]:  Pregnancy loss support  I discussed the assessment and treatment plan with the patient and/or parent/guardian. They were provided an opportunity to ask questions and all were answered. They agreed with the plan and demonstrated an understanding of the instructions.   They were advised to call back or seek an in-person evaluation if the symptoms worsen or if the condition fails to improve as anticipated.  Brandi JAYSON Mering, LCSW     05/24/2024   10:19 AM 05/11/2024    9:49 AM 02/16/2024   10:48 AM 09/28/2023   11:00 AM 01/15/2023   10:37 AM  Depression screen PHQ 2/9  Decreased Interest 2 1 2 2 2   Down, Depressed, Hopeless 2 3 2 2 2   PHQ - 2 Score  4 4 4 4 4   Altered sleeping 3 1 3 3 3   Tired, decreased  energy 2 1 3 3 2   Change in appetite 2 1 2 3 3   Feeling bad or failure about yourself  2 3 2 2 2   Trouble concentrating 2 3 2 3 2   Moving slowly or fidgety/restless 1 1 2 2    Suicidal thoughts 1 1 2 2 2   PHQ-9 Score 17 15 20 22 18   Difficult doing work/chores  Very difficult   Somewhat difficult      05/24/2024   10:19 AM 05/11/2024    9:50 AM 02/16/2024   10:48 AM 09/28/2023   11:00 AM  GAD 7 : Generalized Anxiety Score  Nervous, Anxious, on Edge 2 2 3 3   Control/stop worrying 3 3 3 3   Worry too much - different things 3 3 3 3   Trouble relaxing 3 3 2 3   Restless 2 3 2 3   Easily annoyed or irritable 2 3 2 3   Afraid - awful might happen 2 3 3 3   Total GAD 7 Score 17 20 18 21   Anxiety Difficulty  Very difficult

## 2024-05-25 ENCOUNTER — Encounter: Payer: Self-pay | Admitting: Family Medicine

## 2024-05-25 ENCOUNTER — Telehealth: Payer: Self-pay | Admitting: Certified Nurse Midwife

## 2024-05-25 LAB — VITAMIN D 25 HYDROXY (VIT D DEFICIENCY, FRACTURES): Vit D, 25-Hydroxy: 16 ng/mL — ABNORMAL LOW (ref 30.0–100.0)

## 2024-05-25 LAB — TSH+FREE T4
Free T4: 1.18 ng/dL (ref 0.82–1.77)
TSH: 2.24 u[IU]/mL (ref 0.450–4.500)

## 2024-05-25 LAB — T3, FREE: T3, Free: 3.2 pg/mL (ref 2.0–4.4)

## 2024-05-25 NOTE — Telephone Encounter (Signed)
 Received a call from patient's husband. He was requesting a call back from [place] to ask her about stiches. They wanted to know if they were still in. Message sent to [facility].

## 2024-05-26 ENCOUNTER — Ambulatory Visit: Payer: Self-pay | Admitting: Certified Nurse Midwife

## 2024-05-26 ENCOUNTER — Other Ambulatory Visit (HOSPITAL_COMMUNITY): Payer: Self-pay

## 2024-05-26 DIAGNOSIS — N946 Dysmenorrhea, unspecified: Secondary | ICD-10-CM

## 2024-05-26 DIAGNOSIS — B3731 Acute candidiasis of vulva and vagina: Secondary | ICD-10-CM

## 2024-05-26 DIAGNOSIS — E559 Vitamin D deficiency, unspecified: Secondary | ICD-10-CM

## 2024-05-26 DIAGNOSIS — N760 Acute vaginitis: Secondary | ICD-10-CM

## 2024-05-26 LAB — CERVICOVAGINAL ANCILLARY ONLY
Bacterial Vaginitis (gardnerella): POSITIVE — AB
Candida Glabrata: NEGATIVE
Candida Vaginitis: NEGATIVE
Chlamydia: NEGATIVE
Comment: NEGATIVE
Comment: NEGATIVE
Comment: NEGATIVE
Comment: NEGATIVE
Comment: NEGATIVE
Comment: NORMAL
Neisseria Gonorrhea: NEGATIVE
Trichomonas: NEGATIVE

## 2024-05-26 MED ORDER — VITAMIN D (ERGOCALCIFEROL) 1.25 MG (50000 UNIT) PO CAPS: 50000.0000 [IU] | ORAL_CAPSULE | ORAL | 0 refills | Status: DC

## 2024-05-29 MED ORDER — METRONIDAZOLE 500 MG PO TABS: 500.0000 mg | ORAL_TABLET | Freq: Two times a day (BID) | ORAL | 0 refills | Status: DC

## 2024-05-29 MED ORDER — IBUPROFEN 600 MG PO TABS: 600.0000 mg | ORAL_TABLET | Freq: Four times a day (QID) | ORAL | 0 refills | Status: DC

## 2024-05-29 MED ORDER — FLUCONAZOLE 150 MG PO TABS: 150.0000 mg | ORAL_TABLET | Freq: Every day | ORAL | 1 refills | Status: DC

## 2024-05-29 NOTE — Progress Notes (Signed)
 Asked to assess patient's perineum by Dr. Cresenzo. Confirmed his suspicions of a vaginal infection and answered questions about recurrence of BV/vaginitis in pregnancy. This provoked an outpouring of grief from her husband. Sat down and listened to their concerns, expressed condolences and validated their grief/anger. FOB wanting not to return to [facility], but pt expressed desire for midwifery care going forward. Offered to move them to my care which they accepted. Will manage labs from today accordingly.  Cornell Finder, CNM, MSN, IBCLC Certified Nurse Midwife, [facility]

## 2024-05-31 ENCOUNTER — Telehealth: Payer: Self-pay | Admitting: *Deleted

## 2024-05-31 NOTE — Telephone Encounter (Signed)
 Received a voicemail from this am from patient stating she is trying to reach [facility] and does not have a direct number for her and cannot send her a message directly. She wants to know if Warren can call her before her appointment because she has a few questions. Will route to Jamie, [facility].  Rock Skip PEAK

## 2024-06-05 ENCOUNTER — Encounter: Payer: Self-pay | Admitting: Clinical

## 2024-06-05 ENCOUNTER — Ambulatory Visit: Admitting: Clinical

## 2024-06-05 DIAGNOSIS — F332 Major depressive disorder, recurrent severe without psychotic features: Secondary | ICD-10-CM

## 2024-06-05 DIAGNOSIS — F4321 Adjustment disorder with depressed mood: Secondary | ICD-10-CM | POA: Diagnosis not present

## 2024-06-05 DIAGNOSIS — F411 Generalized anxiety disorder: Secondary | ICD-10-CM

## 2024-06-06 ENCOUNTER — Telehealth: Payer: Self-pay

## 2024-06-06 ENCOUNTER — Encounter (HOSPITAL_COMMUNITY): Payer: Self-pay

## 2024-06-06 LAB — SURGICAL PATHOLOGY

## 2024-06-06 NOTE — Telephone Encounter (Signed)
 Spoke with pt and FOB on phone after reviewing autopsy results and discussing with Dr. Ileana. Reviewed final diagnosis line by line with patient and explained medical terminology, all indicating no obvious reason other than some kind of ischemic event. Answered questions about the narrowing of the cord noted on initial pathology report and confirmed that yes, this could be the site that caused issues but also advised that we would not have been able to measure diameter of a 42cm cord with u/s, this was only something we could have noted after delivery. Explained how these things typically progress - if placenta is having to work harder to get blood flow to the baby, mom's body usually responds with increased workload = hypertension. All previous BP were normal, as was the weekly testing, this does appear to have been a complication that arose quickly and did not give anyone time to intervene.   Reassured parents that all appropriate testing was done and passed with flying colors, and discussed what future pregnancy testing might include (Doppler studies and earlier delivery per Dr. Ileana). Time given to ask questions, pt coming in tomorrow as well and can ask additional questions as needed.   Cornell Finder, CNM, MSN, IBCLC Certified Nurse Midwife, [facility]

## 2024-06-06 NOTE — Telephone Encounter (Signed)
 Pt left voicemail on nurse line requesting that Cornell Finder, CNM call her.  Pt didn't state reason for call.  Her call back number is [phone].    [place], RN

## 2024-06-07 ENCOUNTER — Ambulatory Visit (INDEPENDENT_AMBULATORY_CARE_PROVIDER_SITE_OTHER): Payer: Self-pay | Admitting: Certified Nurse Midwife

## 2024-06-07 ENCOUNTER — Encounter: Payer: Self-pay | Admitting: Certified Nurse Midwife

## 2024-06-07 ENCOUNTER — Other Ambulatory Visit: Payer: Self-pay

## 2024-06-07 VITALS — BP 125/83 | HR 81 | Wt 309.7 lb

## 2024-06-07 DIAGNOSIS — F4321 Adjustment disorder with depressed mood: Secondary | ICD-10-CM | POA: Diagnosis not present

## 2024-06-07 DIAGNOSIS — F419 Anxiety disorder, unspecified: Secondary | ICD-10-CM | POA: Diagnosis not present

## 2024-06-07 DIAGNOSIS — Z3169 Encounter for other general counseling and advice on procreation: Secondary | ICD-10-CM | POA: Diagnosis not present

## 2024-06-07 DIAGNOSIS — E559 Vitamin D deficiency, unspecified: Secondary | ICD-10-CM

## 2024-06-07 DIAGNOSIS — B3731 Acute candidiasis of vulva and vagina: Secondary | ICD-10-CM | POA: Diagnosis not present

## 2024-06-07 DIAGNOSIS — F32A Depression, unspecified: Secondary | ICD-10-CM

## 2024-06-07 MED ORDER — SERTRALINE HCL 25 MG PO TABS: 25.0000 mg | ORAL_TABLET | Freq: Every day | ORAL | 6 refills | Status: DC

## 2024-06-07 NOTE — Progress Notes (Unsigned)
 History:  [name] is a 28 y.o. G1P1000 who presents to clinic today for follow-up of vaginitis, grief and anxiety/depression now exacerbated by her grief.  Much of her vaginal symptoms are gone, but still has thick white discharge and itching. Took one dose of diflucan  two days ago but not the repeat dose.   Reviewed autopsy results yesterday and is still processing the information. FOB still feels something was missed, pt is settling into the understanding that she did nothing wrong, missed nothing and alerted us  at the onset of any noticeable symptoms. Meets with Brandi Manning) regularly, has good community support, and asked to see Brandi Manning.  Has a past history of anxiety/depression with a strong family history. Has not tried medications before but is open to discussing today. Other coping skills include talking to Brandi Manning, friends and family members who have experienced loss, journaling and praying.   Also wants to discuss pregnancy spacing and preconception care.  The following portions of the patient's history were reviewed and updated as appropriate: allergies, current medications, family history, past medical history, social history, past surgical history and problem list.  Review of Systems:  Pertinent items noted in HPI and remainder of comprehensive ROS otherwise negative.   Objective:  Physical Exam BP 125/83   Pulse 81   Wt (!) 309 lb 11.2 oz (140.5 kg)   LMP 05/24/2024 (Approximate)   Breastfeeding No   BMI 47.09 kg/m  Physical Exam Vitals and nursing note reviewed.  Constitutional:      Appearance: Normal appearance.  HENT:     Head: Normocephalic.  Cardiovascular:     Rate and Rhythm: Normal rate and regular rhythm.  Pulmonary:     Effort: Pulmonary effort is normal.  Musculoskeletal:        General: Normal range of motion.  Skin:    General: Skin is warm and dry.  Neurological:     Mental Status: She is alert and oriented to person, place, and time.   Psychiatric:        Mood and Affect: Mood normal.        Behavior: Behavior normal.        Thought Content: Thought content normal.        Judgment: Judgment normal.    Labs and Imaging No results found for this or any previous visit (from the past 24 hours).  No results found.   Assessment & Plan:  1. Grief associated with loss of fetus (Primary) - Brandi Manning, Brandi Manning to room to give pt a loss bracelet an provide encouragement. Also gave a book recommendation. - Dr. Cresenzo to room for encouragement and processing of shared grief - Encouraged pt to continue seeing Brandi Manning), consider a formal therapist, continue journaling and reaching out.  - Reviewed autopsy results again and answered questions, reassured patient that she can ask as many questions as needed to help process the event/loss.  2. Yeast vaginitis - Advised to take 2nd dose of diflucan  and message if symptoms do not improve  3. Anxiety and depression - Reassured that it's normal to have a flare of symptoms after a traumatic event. - Discussed how it can be difficult to fully process trauma when the brain is on such high alert/feeling triggered and how medication can help calm things so she can fully process things and then begin healing. - Pt agreed to trialing zoloft - reviewed possible side effects and suggested dose timing - sertraline (ZOLOFT) 25 MG tablet;  Take 1 tablet (25 mg total) by mouth daily.  Dispense: 30 tablet; Refill: 6  4. Vitamin D  deficiency - Completed dosing early, advised not to take additional vitamin d , will recheck level in a month  5. Preconception counseling - Strongly encouraged to focus on aggressive self-care to prepare for another pregnancy.  - Reviewed lifestyle medicine pillars of stress modulation, good sleep, eating a plant heavy diet and went over plate planning, daily movement and slowly incorporating weight training and more joyful movement (like dance  aerobics at a place like [employer]). - Brainstormed creative ways to cultivate joyful moments even while grieving - Advised to wait at least 9-83mo, ideally 48mo and why closely spaced pregnancies can increase risks like PTL/B/placental issues.  Return in 27mo for vitamin D  and zoloft follow up.  Future Appointments  Date Time Provider Department Manning  07/03/2024  3:45 PM [facility]INIC[facility] [place] [facility]  07/19/2024  8:55 AM Vannie Cornell SAUNDERS, CNM [facility]    Vannie Cornell SAUNDERS, [place] 06/07/2024 5:30 PM

## 2024-06-08 ENCOUNTER — Encounter: Payer: Self-pay | Admitting: Certified Nurse Midwife

## 2024-06-13 ENCOUNTER — Encounter (HOSPITAL_COMMUNITY): Payer: Self-pay

## 2024-06-17 ENCOUNTER — Other Ambulatory Visit: Payer: Self-pay | Admitting: Certified Nurse Midwife

## 2024-06-17 ENCOUNTER — Encounter: Payer: Self-pay | Admitting: Certified Nurse Midwife

## 2024-06-17 DIAGNOSIS — N760 Acute vaginitis: Secondary | ICD-10-CM

## 2024-06-22 ENCOUNTER — Other Ambulatory Visit (HOSPITAL_COMMUNITY)
Admission: RE | Admit: 2024-06-22 | Discharge: 2024-06-22 | Disposition: A | Discharge status: Home / Self Care | Source: Ambulatory Visit | Attending: Family Medicine | Admitting: Family Medicine

## 2024-06-22 ENCOUNTER — Ambulatory Visit

## 2024-06-22 ENCOUNTER — Other Ambulatory Visit: Payer: Self-pay

## 2024-06-22 VITALS — BP 122/72 | HR 76 | Ht 68.0 in | Wt 310.2 lb

## 2024-06-22 DIAGNOSIS — N898 Other specified noninflammatory disorders of vagina: Secondary | ICD-10-CM

## 2024-06-22 DIAGNOSIS — R300 Dysuria: Secondary | ICD-10-CM

## 2024-06-22 LAB — POCT URINALYSIS DIP (DEVICE)
Bilirubin Urine: NEGATIVE
Glucose, UA: NEGATIVE mg/dL
Ketones, ur: NEGATIVE mg/dL
Nitrite: NEGATIVE
Protein, ur: NEGATIVE mg/dL
Specific Gravity, Urine: >=1.03 (ref 1.005–1.030)
Urobilinogen, UA: 0.2 mg/dL (ref 0.0–1.0)
pH: 5.5 (ref 5.0–8.0)

## 2024-06-22 LAB — CERVICOVAGINAL ANCILLARY ONLY
Bacterial Vaginitis (gardnerella): NEGATIVE
Candida Glabrata: NEGATIVE
Candida Vaginitis: NEGATIVE
Comment: NEGATIVE
Comment: NEGATIVE
Comment: NEGATIVE

## 2024-06-22 NOTE — Progress Notes (Signed)
 Here for self swab. Thinks the BV came back after she had her period. She reports vaginal itching, and vaginal soreness and clear/off white vaginal discharge . C/ o burning with urination. Self swab for BV and yeast only per patient request and UA obtained. Explained if wet prep positive she will be contacted for treatment. UA shows some leukocytes and blood which may be from her spotting but sent for culture due to symptoms.  Explained if shows UTI she will be contacted with treatment. She voices understanding. Rock Skip PEAK

## 2024-06-23 ENCOUNTER — Ambulatory Visit: Payer: Self-pay | Admitting: Family Medicine

## 2024-06-23 MED ORDER — FLUCONAZOLE 150 MG PO TABS: 150.0000 mg | ORAL_TABLET | Freq: Once | ORAL | 0 refills | Status: AC

## 2024-06-23 MED ORDER — METRONIDAZOLE 0.75 % VA GEL: 1.0000 | Freq: Every day | VAGINAL | 0 refills | Status: DC

## 2024-06-24 LAB — URINE CULTURE

## 2024-06-28 ENCOUNTER — Ambulatory Visit (INDEPENDENT_AMBULATORY_CARE_PROVIDER_SITE_OTHER): Admitting: Certified Nurse Midwife

## 2024-06-28 ENCOUNTER — Other Ambulatory Visit: Payer: Self-pay

## 2024-06-28 VITALS — BP 127/83 | HR 87 | Wt 314.8 lb

## 2024-06-28 DIAGNOSIS — B3731 Acute candidiasis of vulva and vagina: Secondary | ICD-10-CM

## 2024-06-28 DIAGNOSIS — L929 Granulomatous disorder of the skin and subcutaneous tissue, unspecified: Secondary | ICD-10-CM | POA: Diagnosis not present

## 2024-06-28 MED ORDER — ESTRADIOL 0.01 % VA CREA: 1.0000 | TOPICAL_CREAM | Freq: Every day | VAGINAL | 12 refills | Status: DC

## 2024-06-28 NOTE — Progress Notes (Signed)
 History:  Ms. Rosielee Corporan is a 28 y.o. G1P1000 who presents to clinic today for evaluation of vaginal discomfort - has been treated for yeast and BV, swabs last Friday were negative but she continues to have random, sharp vaginal pain and would like it evaluated. Had one episode of bright red vaginal bleeding last week but it was short-lived and resolved without issue. No other physical complaints.   The following portions of the patient's history were reviewed and updated as appropriate: allergies, current medications, family history, past medical history, social history, past surgical history and problem list.  Review of Systems:  Pertinent items noted in HPI and remainder of comprehensive ROS otherwise negative.   Objective:  Physical Exam BP 127/83   Pulse 87   Wt (!) 314 lb 12.8 oz (142.8 kg)   LMP 05/24/2024 (Approximate)   Breastfeeding No   BMI 47.87 kg/m  Physical Exam Vitals and nursing note reviewed.  Constitutional:      General: She is not in acute distress.    Appearance: Normal appearance. She is not ill-appearing.  Cardiovascular:     Rate and Rhythm: Normal rate and regular rhythm.  Pulmonary:     Effort: Pulmonary effort is normal.  Genitourinary:  Neurological:     Mental Status: She is alert.    Labs and Imaging No results found for this or any previous visit (from the past 24 hours).  No results found.  Assessment & Plan:  1. Granulation tissue (Primary) - Dr. Jeralyn to room for 2nd opinion, agreed pt is likely not healing well due to continued yeast and granulation tissue. Recommended silver nitrate today and vaginal estrogen. - Silver nitrate applied and well tolerated  2. Vaginal tear resulting from childbirth - estradiol (ESTRACE) 0.01 % CREA vaginal cream; Place 1 Applicatorful vaginally at bedtime. Apply nightly for 2 weeks, then 2-3x/week for 3 months.  Dispense: 42.5 g; Refill: 12  3. Yeast vaginitis - Advised to take diflucan  that was  prescribed   Follow up on 07/19/24 for vitamin D , will check in on healing status at that time, otherwise will follow up in 69mo.   Vannie Cornell SAUNDERS, CNM 06/28/2024 6:00 PM

## 2024-06-30 ENCOUNTER — Other Ambulatory Visit: Payer: Self-pay | Admitting: Certified Nurse Midwife

## 2024-06-30 DIAGNOSIS — F32A Depression, unspecified: Secondary | ICD-10-CM

## 2024-07-03 ENCOUNTER — Encounter

## 2024-07-05 ENCOUNTER — Encounter: Payer: Self-pay | Admitting: Certified Nurse Midwife

## 2024-07-10 ENCOUNTER — Encounter: Payer: Self-pay | Admitting: Family Medicine

## 2024-07-10 ENCOUNTER — Ambulatory Visit (INDEPENDENT_AMBULATORY_CARE_PROVIDER_SITE_OTHER): Admitting: Family Medicine

## 2024-07-10 ENCOUNTER — Other Ambulatory Visit: Payer: Self-pay

## 2024-07-10 ENCOUNTER — Other Ambulatory Visit (HOSPITAL_COMMUNITY)
Admission: RE | Admit: 2024-07-10 | Discharge: 2024-07-10 | Disposition: A | Discharge status: Home / Self Care | Source: Ambulatory Visit | Attending: Family Medicine | Admitting: Family Medicine

## 2024-07-10 VITALS — BP 105/73 | HR 73 | Ht 68.0 in | Wt 315.0 lb

## 2024-07-10 DIAGNOSIS — N898 Other specified noninflammatory disorders of vagina: Secondary | ICD-10-CM

## 2024-07-10 DIAGNOSIS — R339 Retention of urine, unspecified: Secondary | ICD-10-CM | POA: Diagnosis not present

## 2024-07-10 LAB — POCT URINALYSIS DIP (DEVICE)
Bilirubin Urine: NEGATIVE
Glucose, UA: NEGATIVE mg/dL
Ketones, ur: NEGATIVE mg/dL
Nitrite: NEGATIVE
Protein, ur: NEGATIVE mg/dL
Specific Gravity, Urine: >=1.03 (ref 1.005–1.030)
Urobilinogen, UA: 0.2 mg/dL (ref 0.0–1.0)
pH: 6 (ref 5.0–8.0)

## 2024-07-10 MED ORDER — ESTRADIOL 0.01 % VA CREA: 1.0000 | TOPICAL_CREAM | VAGINAL | 12 refills | Status: AC

## 2024-07-10 NOTE — Progress Notes (Signed)
   Subjective:    Patient ID: Brandi Manning is a 28 y.o. female presenting with Follow-up  on 07/10/2024  HPI: Having some vaginal odor. Ran out of estradiol. Increased itching. Odor has varied from fishy to sour cream and onion smell LMP 10/11 New spotting. Feels like she is unable to completely empty her bladder  Review of Systems  Constitutional:  Negative for chills and fever.  Respiratory:  Negative for shortness of breath.   Cardiovascular:  Negative for chest pain.  Gastrointestinal:  Negative for abdominal pain, nausea and vomiting.  Genitourinary:  Negative for dysuria.  Skin:  Negative for rash.      Objective:    BP 105/73   Pulse 73   Ht 5' 8 (1.727 m)   Wt (!) 315 lb (142.9 kg)   LMP 06/17/2024   BMI 47.90 kg/m  Physical Exam Exam conducted with a chaperone present.  Constitutional:      General: She is not in acute distress.    Appearance: She is well-developed.  HENT:     Head: Normocephalic and atraumatic.  Eyes:     General: No scleral icterus. Cardiovascular:     Rate and Rhythm: Normal rate.  Pulmonary:     Effort: Pulmonary effort is normal.  Abdominal:     Palpations: Abdomen is soft.  Genitourinary:  Musculoskeletal:     Cervical back: Neck supple.  Skin:    General: Skin is warm and dry.  Neurological:     Mental Status: She is alert and oriented to person, place, and time.    Urinalysis    Component Value Date/Time   COLORURINE YELLOW 04/20/2024 1051   APPEARANCEUR CLEAR 04/20/2024 1051   LABSPEC >=1.030 07/10/2024 1202   LABSPEC 1.030 03/19/2017 1552   PHURINE 6.0 07/10/2024 1202   GLUCOSEU NEGATIVE 07/10/2024 1202   HGBUR SMALL (A) 07/10/2024 1202   BILIRUBINUR NEGATIVE 07/10/2024 1202   BILIRUBINUR negative 03/19/2017 1552   BILIRUBINUR neg 07/29/2016 1435   KETONESUR NEGATIVE 07/10/2024 1202   PROTEINUR NEGATIVE 07/10/2024 1202   UROBILINOGEN 0.2 07/10/2024 1202   NITRITE NEGATIVE 07/10/2024 1202   LEUKOCYTESUR SMALL  (A) 07/10/2024 1202    Procedure: Area of poor healing, treated with AgNO3.     Assessment & Plan:  Vaginal odor - check wet prep and treat accordingly - Plan: Cervicovaginal ancillary only( Page)  Vaginal tear resulting from childbirth - Poor healing, treated with AgNO3 to promote healing. - Plan: estradiol (ESTRACE) 0.01 % CREA vaginal cream  Incomplete emptying of bladder - Urine is not clearly indicative of infection, check culture and treat if needed. - Plan: POCT urinalysis dip (device), Urine Culture    Return in about 1 week (around 07/17/2024) for with Jam.  Glenys GORMAN Birk, MD 07/10/2024 11:39 AM

## 2024-07-11 ENCOUNTER — Ambulatory Visit: Payer: Self-pay | Admitting: Family Medicine

## 2024-07-11 LAB — CERVICOVAGINAL ANCILLARY ONLY
Bacterial Vaginitis (gardnerella): POSITIVE — AB
Candida Glabrata: NEGATIVE
Candida Vaginitis: NEGATIVE
Comment: NEGATIVE
Comment: NEGATIVE
Comment: NEGATIVE

## 2024-07-11 MED ORDER — METRONIDAZOLE 500 MG PO TABS: 500.0000 mg | ORAL_TABLET | Freq: Two times a day (BID) | ORAL | 0 refills | Status: AC

## 2024-07-19 ENCOUNTER — Encounter: Payer: Self-pay | Admitting: Certified Nurse Midwife

## 2024-07-19 ENCOUNTER — Other Ambulatory Visit: Payer: Self-pay

## 2024-07-19 ENCOUNTER — Ambulatory Visit (INDEPENDENT_AMBULATORY_CARE_PROVIDER_SITE_OTHER): Admitting: Clinical

## 2024-07-19 ENCOUNTER — Ambulatory Visit: Payer: Self-pay | Admitting: Certified Nurse Midwife

## 2024-07-19 DIAGNOSIS — F332 Major depressive disorder, recurrent severe without psychotic features: Secondary | ICD-10-CM | POA: Diagnosis not present

## 2024-07-19 DIAGNOSIS — F4321 Adjustment disorder with depressed mood: Secondary | ICD-10-CM

## 2024-07-19 DIAGNOSIS — F411 Generalized anxiety disorder: Secondary | ICD-10-CM

## 2024-07-19 NOTE — Patient Instructions (Signed)
 Center for [facility] at [facility] [address] [place], [place] [address] [phone] (main office) [phone] (Adalina Dopson's office)

## 2024-07-19 NOTE — BH Specialist Note (Signed)
 Integrated Behavioral Health via Telemedicine Visit  07/19/2024 [name] [id number]  Number of Integrated Behavioral Health Clinician visits: 3- Third Visit  Session Start time: 0815   Session End time: 0850  Total time in minutes: 35  Referring Provider: Norleen Rover, MD Patient/Family location: Home [facility] Provider location: Center for [facility]  All persons participating in visit: Patient Brandi Manning and Brandi Manning   Types of Service: Individual psychotherapy and Video visit  I connected with Brandi Manning and/or Brandi Manning's n/a via  Telephone or Engineer, Civil (consulting)  (Video is Caregility application) and verified that I am speaking with the correct person using two identifiers. Discussed confidentiality: Yes   I discussed the limitations of telemedicine and the availability of in person appointments.  Discussed there is a possibility of technology failure and discussed alternative modes of communication if that failure occurs.  I discussed that engaging in this telemedicine visit, they consent to the provision of behavioral healthcare and the services will be billed under their insurance.  Patient and/or legal guardian expressed understanding and consented to Telemedicine visit: Yes   Presenting Concerns: Patient and/or family reports the following symptoms/concerns: Continued grieving the loss of her baby; difficulty going back to work with questions from others, but coping with supportive friends/family/husband. Pt denies SI, no intent and no plan, but at times has thoughts about death/would be okay if she was taken with her baby. Pt has things she's looking forward to; considering trying again for another baby after she is medically cleared. Pt agrees to referral to psychiatry; not taking Zoloft at this time.  Duration of problem: Ongoing depression and anxiety; increase since loss; Severity of  problem: severe  Patient and/or Family's Strengths/Protective Factors: Social connections, Concrete supports in place (healthy food, safe environments, etc.), and Sense of purpose  Goals Addressed: Patient will:  Reduce symptoms of: anxiety and depression    Demonstrate ability to: Continue healthy grieving over loss  Progress towards Goals: Ongoing   Interventions: Interventions utilized:  Supportive Reflection Standardized Assessments completed: GAD-7 and PHQ 9    Patient and/or Family Response: Patient agrees with treatment plan.   Clinical Assessment/Diagnosis  No diagnosis found.   Patient may benefit from continued therapeutic intervention  .  Plan: Follow up with behavioral health clinician on : Call Eland Lamantia at [phone], as needed. Behavioral recommendations:  -Discuss BH medication with medical provider today -Continue allowing time and space to grieve loss  -Continue plan to attend upcoming Thanksgiving dinner with family -Continue daily healthy self-care (kindness to self, healthy meals/healthy sleep; time with supportive people in life) -Continue to accept referral to psychiatry Referral(s): Integrated [facility] (In Clinic) and [facility] (LME/Outside Clinic)  I discussed the assessment and treatment plan with the patient and/or parent/guardian. They were provided an opportunity to ask questions and all were answered. They agreed with the plan and demonstrated an understanding of the instructions.   They were advised to call back or seek an in-person evaluation if the symptoms worsen or if the condition fails to improve as anticipated.  Warren JAYSON Mering, LCSW     07/19/2024    8:37 AM 05/24/2024   10:19 AM 05/11/2024    9:49 AM 02/16/2024   10:48 AM 09/28/2023   11:00 AM  Depression screen PHQ 2/9  Decreased Interest 1 2 1 2 2   Down, Depressed, Hopeless 2 2 3 2 2   PHQ - 2 Score 3  4 4 4 4   Altered sleeping 1 3 1 3  3   Tired, decreased energy 2 2 1 3 3   Change in appetite 2 2 1 2 3   Feeling bad or failure about yourself  3 2 3 2 2   Trouble concentrating 3 2 3 2 3   Moving slowly or fidgety/restless 0 1 1 2 2   Suicidal thoughts 1 1 1 2 2   PHQ-9 Score 15 17  15  20  22    Difficult doing work/chores   Very difficult       Data saved with a previous flowsheet row definition      07/19/2024    8:39 AM 05/24/2024   10:19 AM 05/11/2024    9:50 AM 02/16/2024   10:48 AM  GAD 7 : Generalized Anxiety Score  Nervous, Anxious, on Edge 3 2 2 3   Control/stop worrying 3 3 3 3   Worry too much - different things 3 3 3 3   Trouble relaxing 3 3 3 2   Restless 3 2 3 2   Easily annoyed or irritable 3 2 3 2   Afraid - awful might happen 3 2 3 3   Total GAD 7 Score 21 17 20 18   Anxiety Difficulty   Very difficult

## 2024-07-20 ENCOUNTER — Encounter: Payer: Self-pay | Admitting: Certified Nurse Midwife

## 2024-07-20 NOTE — Progress Notes (Signed)
 History:  [name] is a 28 y.o. G1P1000 who presents to clinic today for evaluation of her slow healing vaginal tear. Is feeling much better after another treatment from Dr. Fredirick last week. No discomfort whatsoever, says it feels normal.   The following portions of the patient's history were reviewed and updated as appropriate: allergies, current medications, family history, past medical history, social history, past surgical history and problem list.  Review of Systems:  Pertinent items noted in HPI and remainder of comprehensive ROS otherwise negative.   Objective:  Physical Exam BP (!) 124/90   Pulse 75   Wt (!) 319 lb 1.6 oz (144.7 kg)   LMP 07/12/2024 (Exact Date)   BMI 48.52 kg/m  Physical Exam Vitals reviewed. Exam conducted with a chaperone present.  Constitutional:      Appearance: Normal appearance.  Cardiovascular:     Rate and Rhythm: Normal rate and regular rhythm.  Pulmonary:     Effort: Pulmonary effort is normal.  Genitourinary:    Comments: Area of tear is less reddened, appears to be healing - no tenderness to palpation.  Musculoskeletal:        General: Normal range of motion.  Neurological:     Mental Status: She is alert and oriented to person, place, and time.    Labs and Imaging No results found for this or any previous visit (from the past 24 hours).  No results found.   Assessment & Plan:  1. Vaginal tear resulting from childbirth (Primary) - Looks to be healing more appropriately and does not hurt anymore - To continue estrogen cream as ordered.  Follow up PRN.   Vannie Cornell SAUNDERS, CNM 07/20/2024 10:30 AM

## 2024-09-16 ENCOUNTER — Encounter: Payer: Self-pay | Admitting: Certified Nurse Midwife

## 2024-09-21 ENCOUNTER — Encounter: Payer: Self-pay | Admitting: *Deleted

## 2024-09-25 ENCOUNTER — Telehealth: Payer: Self-pay | Admitting: Family Medicine

## 2024-09-25 NOTE — Telephone Encounter (Signed)
 Called patient after she cancelled her Annual Visit via [employer] so that we can reschedule. Patient did not answer the call so I left a detailed message with our call back number asking for her to return our call if she would like us  to reschedule her visit.

## 2024-09-27 ENCOUNTER — Ambulatory Visit: Admitting: Certified Nurse Midwife

## 2024-11-22 ENCOUNTER — Ambulatory Visit: Admitting: Certified Nurse Midwife
